# Patient Record
Sex: Male | Born: 1970 | Race: White | Hispanic: Yes | Marital: Married | Smoking: Never smoker
Health system: Southern US, Community
[De-identification: ages and names within clinical notes are randomized; demographics above are authoritative.]

## PROBLEM LIST (undated history)

## (undated) DIAGNOSIS — Z87442 Personal history of urinary calculi: Secondary | ICD-10-CM

## (undated) DIAGNOSIS — K219 Gastro-esophageal reflux disease without esophagitis: Secondary | ICD-10-CM

## (undated) DIAGNOSIS — N529 Male erectile dysfunction, unspecified: Secondary | ICD-10-CM

## (undated) DIAGNOSIS — I10 Essential (primary) hypertension: Secondary | ICD-10-CM

## (undated) DIAGNOSIS — Z8614 Personal history of Methicillin resistant Staphylococcus aureus infection: Secondary | ICD-10-CM

## (undated) DIAGNOSIS — M549 Dorsalgia, unspecified: Secondary | ICD-10-CM

## (undated) DIAGNOSIS — M1990 Unspecified osteoarthritis, unspecified site: Secondary | ICD-10-CM

## (undated) DIAGNOSIS — E7800 Pure hypercholesterolemia, unspecified: Secondary | ICD-10-CM

## (undated) DIAGNOSIS — R011 Cardiac murmur, unspecified: Secondary | ICD-10-CM

## (undated) DIAGNOSIS — G4730 Sleep apnea, unspecified: Secondary | ICD-10-CM

## (undated) HISTORY — PX: LAPAROSCOPIC GASTRIC BYPASS: SUR771

## (undated) HISTORY — PX: SHOULDER SURGERY: SHX246

## (undated) HISTORY — PX: FRACTURE SURGERY: SHX138

## (undated) HISTORY — PX: ELBOW SURGERY: SHX618

## (undated) HISTORY — PX: TONSILLECTOMY: SUR1361

---

## 2014-03-09 DIAGNOSIS — Z8614 Personal history of Methicillin resistant Staphylococcus aureus infection: Secondary | ICD-10-CM

## 2014-03-09 HISTORY — DX: Personal history of Methicillin resistant Staphylococcus aureus infection: Z86.14

## 2015-03-10 HISTORY — PX: SHOULDER SURGERY: SHX246

## 2016-11-07 HISTORY — PX: CERVICAL FUSION: SHX112

## 2017-01-20 ENCOUNTER — Encounter: Payer: Self-pay | Admitting: Emergency Medicine

## 2017-01-20 ENCOUNTER — Emergency Department
Admission: EM | Admit: 2017-01-20 | Discharge: 2017-01-20 | Disposition: A | Discharge status: Home / Self Care | Payer: BLUE CROSS/BLUE SHIELD | Attending: Emergency Medicine | Admitting: Emergency Medicine

## 2017-01-20 DIAGNOSIS — I10 Essential (primary) hypertension: Secondary | ICD-10-CM | POA: Insufficient documentation

## 2017-01-20 DIAGNOSIS — W260XXA Contact with knife, initial encounter: Secondary | ICD-10-CM | POA: Insufficient documentation

## 2017-01-20 DIAGNOSIS — Y93E6 Activity, residential relocation: Secondary | ICD-10-CM | POA: Diagnosis not present

## 2017-01-20 DIAGNOSIS — S61211A Laceration without foreign body of left index finger without damage to nail, initial encounter: Secondary | ICD-10-CM

## 2017-01-20 DIAGNOSIS — Y998 Other external cause status: Secondary | ICD-10-CM | POA: Diagnosis not present

## 2017-01-20 DIAGNOSIS — Y929 Unspecified place or not applicable: Secondary | ICD-10-CM | POA: Insufficient documentation

## 2017-01-20 DIAGNOSIS — S6992XA Unspecified injury of left wrist, hand and finger(s), initial encounter: Secondary | ICD-10-CM | POA: Diagnosis present

## 2017-01-20 HISTORY — DX: Essential (primary) hypertension: I10

## 2017-01-20 NOTE — ED Provider Notes (Signed)
[facility] Emergency Department Provider Note  ____________________________________________  Time seen: Approximately 9:23 PM  I have reviewed the triage vital signs and the nursing notes.   HISTORY  Chief Complaint Laceration    HPI John James is a 46 y.o. male who presents emergency department for a cut to the second digit of his left hand.  Patient reports that he was unpacking from a recent move when he accidentally cut his finger on a knife that was in a box.  Patient did not even feel laceration and only realize he had cut himself after seeing blood.  Patient reports that he was able to control the bleeding with direct pressure.  He has good range of motion to the digit.  He is up-to-date on his tetanus immunization.  No other complaints at this time.  No medications prior to arrival.  Past Medical History:  Diagnosis Date  . Hypertension     There are no active problems to display for this patient.   History reviewed. No pertinent surgical history.  Prior to Admission medications   Not on File    Allergies Patient has no known allergies.  History reviewed. No pertinent family history.  Social History Social History   Tobacco Use  . Smoking status: Never Smoker  . Smokeless tobacco: Never Used  Substance Use Topics  . Alcohol use: Not on file  . Drug use: Not on file     Review of Systems  Constitutional: No fever/chills Cardiovascular: no chest pain. Respiratory: no cough. No SOB. Musculoskeletal: Negative for musculoskeletal pain. Skin: Positive for laceration to the second digit of left hand Neurological: Negative for headaches, focal weakness or numbness. 10-point ROS otherwise negative.  ____________________________________________   PHYSICAL EXAM:  VITAL SIGNS: ED Triage Vitals  Enc Vitals Group     BP 01/20/17 2116 (!) 148/90     Pulse Rate 01/20/17 2116 87     Resp 01/20/17 2116 16     Temp 01/20/17 2112  98 F (36.7 C)     Temp Source 01/20/17 2112 Oral     SpO2 01/20/17 2116 99 %     Weight 01/20/17 2112 210 lb (95.3 kg)     Height 01/20/17 2112 5\' 9"  (1.753 m)     Head Circumference --      Peak Flow --      Pain Score 01/20/17 2117 2     Pain Loc --      Pain Edu? --      Excl. in GC? --      Constitutional: Alert and oriented. Well appearing and in no acute distress. Eyes: Conjunctivae are normal. PERRL. EOMI. Head: Atraumatic. Neck: No stridor.    Cardiovascular: Normal rate, regular rhythm. Normal S1 and S2.  Good peripheral circulation. Respiratory: Normal respiratory effort without tachypnea or retractions. Lungs CTAB. Good air entry to the bases with no decreased or absent breath sounds. Musculoskeletal: Full range of motion to all extremities. No gross deformities appreciated. Neurologic:  Normal speech and language. No gross focal neurologic deficits are appreciated.  Skin:  Skin is warm, dry and intact. No rash noted.  Positive for superficial laceration to the proximal phalanx of the second digit dorsal aspect.  Full range of motion to the digit.  Edges are smooth in nature, not gaped open, no bleeding or foreign body.  Laceration measures approximately 0.75 cm in length.  Sensation and cap refill intact distally. Psychiatric: Mood and affect are normal. Speech and behavior  are normal. Patient exhibits appropriate insight and judgement.   ____________________________________________   LABS (all labs ordered are listed, but only abnormal results are displayed)  Labs Reviewed - No data to display ____________________________________________  EKG   ____________________________________________  RADIOLOGY   No results found.  ____________________________________________    PROCEDURES  Procedure(s) performed:    Marland Kitchen.Marland Kitchen.Laceration Repair Date/Time: 01/20/2017 9:32 PM Performed by: Racheal Patchesuthriell, Jayleigh Notarianni D, PA-C Authorized by: Racheal Patchesuthriell, Yaw Escoto D, PA-C    Consent:    Consent obtained:  Verbal   Consent given by:  Patient   Risks discussed:  Pain Anesthesia (see MAR for exact dosages):    Anesthesia method:  None Laceration details:    Location:  Finger   Finger location:  L index finger   Length (cm):  0.8 Repair type:    Repair type:  Simple Exploration:    Hemostasis achieved with:  Direct pressure   Wound exploration: wound explored through full range of motion and entire depth of wound probed and visualized     Wound extent: no foreign bodies/material noted, no muscle damage noted, no nerve damage noted, no tendon damage noted, no underlying fracture noted and no vascular damage noted     Contaminated: no   Treatment:    Area cleansed with:  Shur-Clens   Amount of cleaning:  Standard Skin repair:    Repair method:  Steri-Strips and tissue adhesive   Number of Steri-Strips:  3 Approximation:    Approximation:  Close Post-procedure details:    Dressing:  Open (no dressing)   Patient tolerance of procedure:  Tolerated well, no immediate complications Comments:     Wound is sealed using Dermabond.  3 Steri-Strips were applied over top for further stabilization of a small laceration.      Medications - No data to display   ____________________________________________   INITIAL IMPRESSION / ASSESSMENT AND PLAN / ED COURSE  Pertinent labs & imaging results that were available during my care of the patient were reviewed by me and considered in my medical decision making (see chart for details).  Review of the [place] CSRS was performed in accordance of the [facility] prior to dispensing any controlled drugs.     Patient's diagnosis is consistent with finger laceration.  She presented with small laceration to the dorsal aspect of the second digit.  Area was closable with either 2 sutures or Dermabond and Steri-Strips.  Patient given choice, chooses Dermabond and Steri-Strips.  These were applied as described above.  No  complications.  Wound care instructions given to patient.  No prescriptions at this time.  He was up-to-date on tetanus immunization.  Patient will follow primary care as needed..  Patient is given ED precautions to return to the ED for any worsening or new symptoms.     ____________________________________________  FINAL CLINICAL IMPRESSION(S) / ED DIAGNOSES  Final diagnoses:  Laceration of left index finger without foreign body without damage to nail, initial encounter      NEW MEDICATIONS STARTED DURING THIS VISIT:  ED Discharge Orders    None          This chart was dictated using voice recognition software/Dragon. Despite best efforts to proofread, errors can occur which can change the meaning. Any change was purely unintentional.    Racheal PatchesCuthriell, Shalona Harbour D, PA-C 01/20/17 2148    Phineas SemenGoodman, Graydon, MD 01/20/17 [id number]

## 2017-01-20 NOTE — ED Triage Notes (Signed)
Pt reports he cut his left index finger with a knife that was in a box that pt was moving. Pt has approximate 1 inch laceration to the area with bleeding controled. New bandage applied in triage.

## 2017-03-23 ENCOUNTER — Other Ambulatory Visit: Payer: Self-pay | Admitting: Orthopedic Surgery

## 2017-03-23 DIAGNOSIS — M25562 Pain in left knee: Secondary | ICD-10-CM

## 2017-03-30 ENCOUNTER — Ambulatory Visit
Admission: RE | Admit: 2017-03-30 | Discharge: 2017-03-30 | Disposition: A | Discharge status: Home / Self Care | Payer: BLUE CROSS/BLUE SHIELD | Source: Ambulatory Visit | Attending: Orthopedic Surgery | Admitting: Orthopedic Surgery

## 2017-03-30 DIAGNOSIS — X58XXXA Exposure to other specified factors, initial encounter: Secondary | ICD-10-CM | POA: Insufficient documentation

## 2017-03-30 DIAGNOSIS — M25562 Pain in left knee: Secondary | ICD-10-CM

## 2017-03-30 DIAGNOSIS — R600 Localized edema: Secondary | ICD-10-CM | POA: Insufficient documentation

## 2017-03-30 DIAGNOSIS — S83282A Other tear of lateral meniscus, current injury, left knee, initial encounter: Secondary | ICD-10-CM | POA: Diagnosis not present

## 2017-05-24 ENCOUNTER — Ambulatory Visit: Admission: RE | Admit: 2017-05-24 | Payer: BLUE CROSS/BLUE SHIELD | Source: Ambulatory Visit

## 2017-06-17 ENCOUNTER — Inpatient Hospital Stay
Admission: RE | Admit: 2017-06-17 | Discharge: 2017-06-17 | Disposition: A | Discharge status: Home / Self Care | Payer: No Typology Code available for payment source | Source: Ambulatory Visit

## 2017-06-17 HISTORY — DX: Pure hypercholesterolemia, unspecified: E78.00

## 2017-06-17 HISTORY — DX: Unspecified osteoarthritis, unspecified site: M19.90

## 2017-06-18 ENCOUNTER — Inpatient Hospital Stay: Admission: RE | Admit: 2017-06-18 | Payer: No Typology Code available for payment source | Source: Ambulatory Visit

## 2017-06-21 ENCOUNTER — Inpatient Hospital Stay: Admission: RE | Admit: 2017-06-21 | Payer: No Typology Code available for payment source | Source: Ambulatory Visit

## 2017-06-22 ENCOUNTER — Inpatient Hospital Stay
Admission: RE | Admit: 2017-06-22 | Discharge: 2017-06-22 | Disposition: A | Discharge status: Home / Self Care | Payer: No Typology Code available for payment source | Source: Ambulatory Visit

## 2017-06-22 NOTE — Pre-Procedure Instructions (Signed)
[facility] ABOUT MULTIPLE CALLS TO PT FOR PREOP INTERVIEW AND HE IS NOT RETURNING ANY OF MY CALLS.  CASEY SAID SHE TRIED TO GET IN TOUCH WITH PT AND HE HAS NOT RETURNED HER CALL EITHER.

## 2017-06-24 ENCOUNTER — Ambulatory Visit
Admission: RE | Admit: 2017-06-24 | Payer: BLUE CROSS/BLUE SHIELD | Source: Ambulatory Visit | Admitting: Orthopedic Surgery

## 2017-06-24 ENCOUNTER — Encounter: Admission: RE | Payer: Self-pay | Source: Ambulatory Visit

## 2017-06-24 SURGERY — ARTHROSCOPY, KNEE, WITH MEDIAL MENISCECTOMY
Anesthesia: Choice | Laterality: Left

## 2017-07-06 ENCOUNTER — Other Ambulatory Visit: Payer: Self-pay | Admitting: Internal Medicine

## 2017-07-06 DIAGNOSIS — R7989 Other specified abnormal findings of blood chemistry: Secondary | ICD-10-CM

## 2017-07-06 DIAGNOSIS — R945 Abnormal results of liver function studies: Principal | ICD-10-CM

## 2017-07-06 DIAGNOSIS — R42 Dizziness and giddiness: Secondary | ICD-10-CM

## 2017-07-15 ENCOUNTER — Ambulatory Visit: Payer: BLUE CROSS/BLUE SHIELD

## 2017-07-15 ENCOUNTER — Ambulatory Visit: Admission: RE | Admit: 2017-07-15 | Payer: BLUE CROSS/BLUE SHIELD | Source: Ambulatory Visit

## 2017-09-22 ENCOUNTER — Other Ambulatory Visit: Payer: Self-pay

## 2017-09-22 ENCOUNTER — Other Ambulatory Visit: Payer: Self-pay | Admitting: Physical Medicine and Rehabilitation

## 2017-09-22 DIAGNOSIS — M5416 Radiculopathy, lumbar region: Secondary | ICD-10-CM

## 2017-09-22 DIAGNOSIS — M5412 Radiculopathy, cervical region: Secondary | ICD-10-CM

## 2017-10-11 ENCOUNTER — Ambulatory Visit
Admission: RE | Admit: 2017-10-11 | Discharge: 2017-10-11 | Disposition: A | Discharge status: Home / Self Care | Payer: BLUE CROSS/BLUE SHIELD | Source: Ambulatory Visit | Attending: Physical Medicine and Rehabilitation | Admitting: Physical Medicine and Rehabilitation

## 2017-10-11 DIAGNOSIS — M5416 Radiculopathy, lumbar region: Secondary | ICD-10-CM | POA: Diagnosis present

## 2017-10-11 DIAGNOSIS — M48061 Spinal stenosis, lumbar region without neurogenic claudication: Secondary | ICD-10-CM | POA: Insufficient documentation

## 2017-10-11 DIAGNOSIS — M5126 Other intervertebral disc displacement, lumbar region: Secondary | ICD-10-CM | POA: Insufficient documentation

## 2017-10-11 DIAGNOSIS — M5412 Radiculopathy, cervical region: Secondary | ICD-10-CM

## 2018-01-05 ENCOUNTER — Encounter
Admission: RE | Admit: 2018-01-05 | Discharge: 2018-01-05 | Disposition: A | Discharge status: Home / Self Care | Payer: BLUE CROSS/BLUE SHIELD | Source: Ambulatory Visit | Attending: Orthopedic Surgery | Admitting: Orthopedic Surgery

## 2018-01-05 ENCOUNTER — Other Ambulatory Visit: Payer: Self-pay

## 2018-01-05 DIAGNOSIS — I10 Essential (primary) hypertension: Secondary | ICD-10-CM | POA: Insufficient documentation

## 2018-01-05 DIAGNOSIS — Z01810 Encounter for preprocedural cardiovascular examination: Secondary | ICD-10-CM | POA: Diagnosis not present

## 2018-01-05 HISTORY — DX: Cardiac murmur, unspecified: R01.1

## 2018-01-05 HISTORY — DX: Male erectile dysfunction, unspecified: N52.9

## 2018-01-05 HISTORY — DX: Personal history of Methicillin resistant Staphylococcus aureus infection: Z86.14

## 2018-01-05 HISTORY — DX: Sleep apnea, unspecified: G47.30

## 2018-01-05 NOTE — Patient Instructions (Signed)
Your procedure is scheduled on: Thursday, January 13, 2018 Report to Day Surgery on the 2nd floor of the [facility] To find out your arrival time, please call [phone] between 1PM - 3PM on: Wednesday, January 12, 2018  REMEMBER: Instructions that are not followed completely may result in serious medical risk, up to and including death; or upon the discretion of your surgeon and anesthesiologist your surgery may need to be rescheduled.  Do not eat food after midnight the night before surgery.  No gum chewing, lozengers or hard candies.  You may however, drink CLEAR liquids up to 2 hours before you are scheduled to arrive for your surgery. Do not drink anything within 2 hours of the start of your surgery.  Clear liquids include: - water  - apple juice without pulp - gatorade - black coffee or tea (Do NOT add milk or creamers to the coffee or tea) Do NOT drink anything that is not on this list.  No Alcohol for 24 hours before or after surgery.  No Smoking including e-cigarettes for 24 hours prior to surgery.  No chewable tobacco products for at least 6 hours prior to surgery.  No nicotine patches on the day of surgery.  On the morning of surgery brush your teeth with toothpaste and water, you may rinse your mouth with mouthwash if you wish. Do not swallow any toothpaste or mouthwash.  Notify your doctor if there is any change in your medical condition (cold, fever, infection).  Do not wear jewelry, make-up, hairpins, clips or nail polish.  Do not wear lotions, powders, or perfumes.   Do not shave 48 hours prior to surgery.   Contacts and dentures may not be worn into surgery.  Do not bring valuables to the hospital, including drivers license, insurance or credit cards.  [facility] is not responsible for any belongings or valuables.   TAKE THESE MEDICATIONS THE MORNING OF SURGERY:   1.  Tramadol (if needed for pain)  Use CHG Soap as directed on instruction  sheet.  Bring your C-PAP to the hospital with you in case you may have to spend the night.   NOW!  Stop Anti-inflammatories (NSAIDS) such as Advil, Aleve, Ibuprofen, Motrin, Naproxen, Naprosyn and Aspirin based products such as Excedrin, Goodys Powder, BC Powder. (May take Tylenol or Acetaminophen if needed.)  NOW! Stop ANY OVER THE COUNTER supplements until after surgery.  Wear comfortable clothing (specific to your surgery type) to the hospital.  Plan for stool softeners for home use.  If you are being discharged the day of surgery, you will not be allowed to drive home. You will need a responsible adult to drive you home and stay with you that night.   If you are taking public transportation, you will need to have a responsible adult with you. Please confirm with your physician that it is acceptable to use public transportation.   Please call [phone] if you have any questions about these instructions.

## 2018-01-12 MED ORDER — CEFAZOLIN SODIUM-DEXTROSE 2-4 GM/100ML-% IV SOLN
2.0000 g | Freq: Once | INTRAVENOUS | Status: AC
  Administered 2018-01-13: 2 g via INTRAVENOUS

## 2018-01-13 ENCOUNTER — Ambulatory Visit
Admission: RE | Admit: 2018-01-13 | Discharge: 2018-01-13 | Disposition: A | Discharge status: Home / Self Care | Payer: BLUE CROSS/BLUE SHIELD | Source: Ambulatory Visit | Attending: Orthopedic Surgery | Admitting: Orthopedic Surgery

## 2018-01-13 ENCOUNTER — Ambulatory Visit: Payer: BLUE CROSS/BLUE SHIELD | Admitting: Certified Registered Nurse Anesthetist

## 2018-01-13 ENCOUNTER — Encounter
Admission: RE | Disposition: A | Discharge status: Home / Self Care | Payer: Self-pay | Source: Ambulatory Visit | Attending: Orthopedic Surgery

## 2018-01-13 ENCOUNTER — Encounter: Payer: Self-pay | Admitting: *Deleted

## 2018-01-13 ENCOUNTER — Other Ambulatory Visit: Payer: Self-pay

## 2018-01-13 DIAGNOSIS — S83282A Other tear of lateral meniscus, current injury, left knee, initial encounter: Secondary | ICD-10-CM | POA: Diagnosis present

## 2018-01-13 DIAGNOSIS — M23262 Derangement of other lateral meniscus due to old tear or injury, left knee: Secondary | ICD-10-CM | POA: Diagnosis not present

## 2018-01-13 DIAGNOSIS — Z9884 Bariatric surgery status: Secondary | ICD-10-CM | POA: Diagnosis not present

## 2018-01-13 DIAGNOSIS — M6752 Plica syndrome, left knee: Secondary | ICD-10-CM | POA: Insufficient documentation

## 2018-01-13 DIAGNOSIS — Z820 Family history of epilepsy and other diseases of the nervous system: Secondary | ICD-10-CM | POA: Insufficient documentation

## 2018-01-13 DIAGNOSIS — I10 Essential (primary) hypertension: Secondary | ICD-10-CM | POA: Insufficient documentation

## 2018-01-13 DIAGNOSIS — M1990 Unspecified osteoarthritis, unspecified site: Secondary | ICD-10-CM | POA: Insufficient documentation

## 2018-01-13 DIAGNOSIS — Z79899 Other long term (current) drug therapy: Secondary | ICD-10-CM | POA: Insufficient documentation

## 2018-01-13 DIAGNOSIS — E7800 Pure hypercholesterolemia, unspecified: Secondary | ICD-10-CM | POA: Insufficient documentation

## 2018-01-13 DIAGNOSIS — G4730 Sleep apnea, unspecified: Secondary | ICD-10-CM | POA: Diagnosis not present

## 2018-01-13 HISTORY — PX: KNEE ARTHROSCOPY WITH MEDIAL MENISECTOMY: SHX5651

## 2018-01-13 SURGERY — ARTHROSCOPY, KNEE, WITH MEDIAL MENISCECTOMY
Anesthesia: General | Laterality: Left

## 2018-01-13 MED ORDER — PHENYLEPHRINE HCL 10 MG/ML IJ SOLN
INTRAMUSCULAR | Status: DC | PRN
  Administered 2018-01-13 (×2): 100 ug via INTRAVENOUS

## 2018-01-13 MED ORDER — FENTANYL CITRATE (PF) 100 MCG/2ML IJ SOLN
INTRAMUSCULAR | Status: DC | PRN
  Administered 2018-01-13 (×4): 25 ug via INTRAVENOUS

## 2018-01-13 MED ORDER — ONDANSETRON HCL 4 MG/2ML IJ SOLN
INTRAMUSCULAR | Status: AC
  Filled 2018-01-13: qty 2

## 2018-01-13 MED ORDER — PROPOFOL 10 MG/ML IV BOLUS
INTRAVENOUS | Status: DC | PRN
  Administered 2018-01-13: 200 mg via INTRAVENOUS

## 2018-01-13 MED ORDER — ONDANSETRON HCL 4 MG/2ML IJ SOLN: 4.0000 mg | Freq: Four times a day (QID) | INTRAMUSCULAR | Status: DC | PRN

## 2018-01-13 MED ORDER — MIDAZOLAM HCL 2 MG/2ML IJ SOLN
INTRAMUSCULAR | Status: DC | PRN
  Administered 2018-01-13: 2 mg via INTRAVENOUS

## 2018-01-13 MED ORDER — CEFAZOLIN SODIUM-DEXTROSE 2-4 GM/100ML-% IV SOLN
INTRAVENOUS | Status: AC
  Filled 2018-01-13: qty 100

## 2018-01-13 MED ORDER — FENTANYL CITRATE (PF) 100 MCG/2ML IJ SOLN
INTRAMUSCULAR | Status: AC
  Administered 2018-01-13: 25 ug via INTRAVENOUS
  Filled 2018-01-13: qty 2

## 2018-01-13 MED ORDER — HYDROCODONE-ACETAMINOPHEN 5-325 MG PO TABS: 1.0000 | ORAL_TABLET | Freq: Four times a day (QID) | ORAL | 0 refills | Status: DC | PRN

## 2018-01-13 MED ORDER — FAMOTIDINE 20 MG PO TABS
20.0000 mg | ORAL_TABLET | Freq: Once | ORAL | Status: AC
  Administered 2018-01-13: 20 mg via ORAL

## 2018-01-13 MED ORDER — LIDOCAINE HCL (CARDIAC) PF 100 MG/5ML IV SOSY
PREFILLED_SYRINGE | INTRAVENOUS | Status: DC | PRN
  Administered 2018-01-13: 60 mg via INTRAVENOUS

## 2018-01-13 MED ORDER — EPHEDRINE SULFATE 50 MG/ML IJ SOLN
INTRAMUSCULAR | Status: DC | PRN
  Administered 2018-01-13: 10 mg via INTRAVENOUS
  Administered 2018-01-13: 15 mg via INTRAVENOUS
  Administered 2018-01-13: 5 mg via INTRAVENOUS
  Administered 2018-01-13: 10 mg via INTRAVENOUS

## 2018-01-13 MED ORDER — ONDANSETRON HCL 4 MG/2ML IJ SOLN: 4.0000 mg | Freq: Once | INTRAMUSCULAR | Status: DC | PRN

## 2018-01-13 MED ORDER — DEXAMETHASONE SODIUM PHOSPHATE 10 MG/ML IJ SOLN
INTRAMUSCULAR | Status: AC
  Filled 2018-01-13: qty 1

## 2018-01-13 MED ORDER — HYDROCODONE-ACETAMINOPHEN 5-325 MG PO TABS: 1.0000 | ORAL_TABLET | ORAL | Status: DC | PRN

## 2018-01-13 MED ORDER — PROPOFOL 10 MG/ML IV BOLUS
INTRAVENOUS | Status: AC
  Filled 2018-01-13: qty 20

## 2018-01-13 MED ORDER — FENTANYL CITRATE (PF) 100 MCG/2ML IJ SOLN
INTRAMUSCULAR | Status: AC
  Filled 2018-01-13: qty 2

## 2018-01-13 MED ORDER — FENTANYL CITRATE (PF) 100 MCG/2ML IJ SOLN
25.0000 ug | INTRAMUSCULAR | Status: DC | PRN
  Administered 2018-01-13 (×4): 25 ug via INTRAVENOUS

## 2018-01-13 MED ORDER — DEXAMETHASONE SODIUM PHOSPHATE 10 MG/ML IJ SOLN
INTRAMUSCULAR | Status: DC | PRN
  Administered 2018-01-13: 10 mg via INTRAVENOUS

## 2018-01-13 MED ORDER — FAMOTIDINE 20 MG PO TABS
ORAL_TABLET | ORAL | Status: AC
  Administered 2018-01-13: 20 mg via ORAL
  Filled 2018-01-13: qty 1

## 2018-01-13 MED ORDER — SODIUM CHLORIDE 0.9 % IV SOLN: INTRAVENOUS | Status: DC

## 2018-01-13 MED ORDER — ONDANSETRON HCL 4 MG/2ML IJ SOLN
INTRAMUSCULAR | Status: DC | PRN
  Administered 2018-01-13: 4 mg via INTRAVENOUS

## 2018-01-13 MED ORDER — METOCLOPRAMIDE HCL 10 MG PO TABS: 5.0000 mg | ORAL_TABLET | Freq: Three times a day (TID) | ORAL | Status: DC | PRN

## 2018-01-13 MED ORDER — LACTATED RINGERS IV SOLN
INTRAVENOUS | Status: DC
  Administered 2018-01-13: 12:00:00 via INTRAVENOUS

## 2018-01-13 MED ORDER — ONDANSETRON HCL 4 MG PO TABS: 4.0000 mg | ORAL_TABLET | Freq: Four times a day (QID) | ORAL | Status: DC | PRN

## 2018-01-13 MED ORDER — MIDAZOLAM HCL 2 MG/2ML IJ SOLN
INTRAMUSCULAR | Status: AC
  Filled 2018-01-13: qty 2

## 2018-01-13 MED ORDER — METOCLOPRAMIDE HCL 5 MG/ML IJ SOLN: 5.0000 mg | Freq: Three times a day (TID) | INTRAMUSCULAR | Status: DC | PRN

## 2018-01-13 MED ORDER — LIDOCAINE HCL (PF) 2 % IJ SOLN
INTRAMUSCULAR | Status: AC
  Filled 2018-01-13: qty 10

## 2018-01-13 MED ORDER — BUPIVACAINE-EPINEPHRINE (PF) 0.5% -1:200000 IJ SOLN
INTRAMUSCULAR | Status: DC | PRN
  Administered 2018-01-13: 20 mL via PERINEURAL

## 2018-01-13 SURGICAL SUPPLY — 29 items
BANDAGE ACE 4X5 VEL STRL LF (GAUZE/BANDAGES/DRESSINGS) IMPLANT
BLADE INCISOR PLUS 4.5 (BLADE) IMPLANT
CHLORAPREP W/TINT 26ML (MISCELLANEOUS) ×2 IMPLANT
COVER WAND RF STERILE (DRAPES) ×2 IMPLANT
CUFF TOURN 24 STER (MISCELLANEOUS) IMPLANT
CUFF TOURN 30 STER DUAL PORT (MISCELLANEOUS) ×1 IMPLANT
GAUZE SPONGE 4X4 12PLY STRL (GAUZE/BANDAGES/DRESSINGS) ×2 IMPLANT
GLOVE SURG SYN 9.0  PF PI (GLOVE) ×1
GLOVE SURG SYN 9.0 PF PI (GLOVE) ×1 IMPLANT
GOWN SRG 2XL LVL 4 RGLN SLV (GOWNS) ×1 IMPLANT
GOWN STRL NON-REIN 2XL LVL4 (GOWNS) ×1
GOWN STRL REUS W/ TWL LRG LVL3 (GOWN DISPOSABLE) ×2 IMPLANT
GOWN STRL REUS W/TWL LRG LVL3 (GOWN DISPOSABLE) ×2
GREAT WHITE STEALTH ×1 IMPLANT
IV LACTATED RINGER IRRG 3000ML (IV SOLUTION) ×2
IV LR IRRIG 3000ML ARTHROMATIC (IV SOLUTION) ×2 IMPLANT
KIT TURNOVER KIT A (KITS) ×2 IMPLANT
MANIFOLD NEPTUNE II (INSTRUMENTS) ×2 IMPLANT
NEEDLE HYPO 22GX1.5 SAFETY (NEEDLE) ×2 IMPLANT
PACK ARTHROSCOPY KNEE (MISCELLANEOUS) ×2 IMPLANT
PROBE BIPOLAR 50 DEGREE SUCT (MISCELLANEOUS) ×1 IMPLANT
SCALPEL PROTECTED #11 DISP (BLADE) ×2 IMPLANT
SET TUBE SUCT SHAVER OUTFL 24K (TUBING) ×2 IMPLANT
SET TUBE TIP INTRA-ARTICULAR (MISCELLANEOUS) ×1 IMPLANT
SUT ETHILON 4-0 (SUTURE) ×1
SUT ETHILON 4-0 FS2 18XMFL BLK (SUTURE) ×1
SUTURE ETHLN 4-0 FS2 18XMF BLK (SUTURE) ×1 IMPLANT
TUBING ARTHRO INFLOW-ONLY STRL (TUBING) ×2 IMPLANT
WAND COBLATION FLOW 50 (SURGICAL WAND) ×1 IMPLANT

## 2018-01-13 NOTE — H&P (Signed)
Reviewed paper H+P, will be scanned into chart. No changes noted.  

## 2018-01-13 NOTE — Op Note (Signed)
01/13/2018  2:50 PM  PATIENT:  John James  47 y.o. male  PRE-OPERATIVE DIAGNOSIS:  MECHANICAL KNEE PAIN LEFT knee  POST-OPERATIVE DIAGNOSIS: Lateral meniscus tear left knee with plica band  PROCEDURE:  Procedure(s): KNEE ARTHROSCOPY WITH MEDIAL MENISECTOMY (Left), partial synovectomy SURGEON: Leitha Schuller, MD  ASSISTANTS: None  ANESTHESIA:   general  EBL:  Total I/O In: 800 [I.V.:800] Out: 25 [Blood:25]  BLOOD ADMINISTERED:none  DRAINS: none   LOCAL MEDICATIONS USED:  NONE  SPECIMEN:  No Specimen  DISPOSITION OF SPECIMEN:  N/A  COUNTS:  YES  TOURNIQUET:  * Missing tourniquet times found for documented tourniquets in log: [id number] *  IMPLANTS: None  DICTATION: .Dragon Dictation patient was brought to the operating room and after adequate general anesthesia was obtained the left leg was placed in the arthroscopic leg holder with a tourniquet applied but not required.  After patient identification and timeout procedures were completed, an inferolateral portal was made and the arthroscope introduced.  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  Coming on the medial compartment inferior medial portal was made and the medial compartment was normal in appearance except for some anterior synovitis and intact ACL.  The lateral compartment significant for radial tear with some horizontal component in the midportion of the middle third at this point the arthroscopic shaver was used to debride the plica as well as some inflamed inflamed fat pad anteriorly as well as that synovium impinging on the anterior medial compartment the medial meniscus was intact after addressing this pathology the lateral meniscus was addressed with ArthroCare wand getting back to a stable margin and is loose one small flap tear that could be displaced of the joint was  addressed the meniscus appeared stable after this the gutters were checked and there were no loose bodies after thorough irrigation of the knee almost mentation was withdrawn and the wounds were closed with simple interrupted 4-0 nylon.  Xeroform 4 x 4's web roll and Ace wrap applied pre-and post procedure pictures obtained through the arthroscope  PLAN OF CARE: Discharge to home after PACU  PATIENT DISPOSITION:  PACU - hemodynamically stable.

## 2018-01-13 NOTE — Transfer of Care (Signed)
Immediate Anesthesia Transfer of Care Note  Patient: John James  Procedure(s) Performed: KNEE ARTHROSCOPY WITH MEDIAL MENISECTOMY (Left )  Patient Location: PACU  Anesthesia Type:General  Level of Consciousness: drowsy  Airway & Oxygen Therapy: Patient Spontanous Breathing and Patient connected to face mask oxygen  Post-op Assessment: Report given to RN and Post -op Vital signs reviewed and stable  Post vital signs: Reviewed and stable  Last Vitals:  Vitals Value Taken Time  BP 91/58 01/13/2018  2:54 PM  Temp 36.4 C 01/13/2018  2:54 PM  Pulse 74 01/13/2018  2:55 PM  Resp 14 01/13/2018  2:55 PM  SpO2 94 % 01/13/2018  2:55 PM  Vitals shown include unvalidated device data.  Last Pain:  Vitals:   01/13/18 1115  TempSrc: Tympanic  PainSc: 0-No pain         Complications: No apparent anesthesia complications

## 2018-01-13 NOTE — Discharge Instructions (Signed)
Weightbearing as tolerated on left leg to try to minimize activities through the weekend.  Pain medicine as directed.  Aspirin 81 mg twice a day for 3 weeks.  Dressing in place keep clean and dry.

## 2018-01-13 NOTE — Anesthesia Post-op Follow-up Note (Signed)
Anesthesia QCDR form completed.        

## 2018-01-13 NOTE — Anesthesia Preprocedure Evaluation (Addendum)
Anesthesia Evaluation  Patient identified by MRN, date of birth, ID band Patient awake    Reviewed: Allergy & Precautions, NPO status , Patient's Chart, lab work & pertinent test results  History of Anesthesia Complications Negative for: history of anesthetic complications  Airway Mallampati: II       Dental   Pulmonary sleep apnea and Continuous Positive Airway Pressure Ventilation , neg COPD,           Cardiovascular hypertension, Pt. on medications + Valvular Problems/Murmurs      Neuro/Psych neg Seizures    GI/Hepatic Neg liver ROS, neg GERD  ,  Endo/Other  neg diabetes  Renal/GU negative Renal ROS     Musculoskeletal   Abdominal   Peds  Hematology   Anesthesia Other Findings   Reproductive/Obstetrics                            Anesthesia Physical Anesthesia Plan  ASA: III  Anesthesia Plan: General   Post-op Pain Management:    Induction:   PONV Risk Score and Plan:   Airway Management Planned: LMA  Additional Equipment:   Intra-op Plan:   Post-operative Plan:   Informed Consent: I have reviewed the patients History and Physical, chart, labs and discussed the procedure including the risks, benefits and alternatives for the proposed anesthesia with the patient or authorized representative who has indicated his/her understanding and acceptance.     Plan Discussed with:   Anesthesia Plan Comments:        Anesthesia Quick Evaluation

## 2018-01-13 NOTE — Anesthesia Procedure Notes (Signed)
Procedure Name: LMA Insertion Date/Time: 01/13/2018 2:10 PM Performed by: Mariana Kaufman, RN Pre-anesthesia Checklist: Patient identified, Emergency Drugs available, Suction available, Patient being monitored and Timeout performed Patient Re-evaluated:Patient Re-evaluated prior to induction Oxygen Delivery Method: Circle system utilized Preoxygenation: Pre-oxygenation with 100% oxygen Induction Type: IV induction Ventilation: Mask ventilation without difficulty LMA: LMA inserted LMA Size: 4.5 Number of attempts: 1 Placement Confirmation: positive ETCO2 and breath sounds checked- equal and bilateral Tube secured with: Tape Dental Injury: Teeth and Oropharynx as per pre-operative assessment

## 2018-01-13 NOTE — Anesthesia Postprocedure Evaluation (Signed)
Anesthesia Post Note  Patient: John James  Procedure(s) Performed: KNEE ARTHROSCOPY WITH MEDIAL MENISECTOMY (Left )  Patient location during evaluation: PACU Anesthesia Type: General Level of consciousness: awake and alert Pain management: pain level controlled Vital Signs Assessment: post-procedure vital signs reviewed and stable Respiratory status: spontaneous breathing and respiratory function stable Cardiovascular status: stable Anesthetic complications: no     Last Vitals:  Vitals:   01/13/18 1524 01/13/18 1529  BP: 123/84 123/84  Pulse: 86 89  Resp: 11 15  Temp:    SpO2: 95% 91%    Last Pain:  Vitals:   01/13/18 1529  TempSrc:   PainSc: 5                  [name]

## 2018-01-14 ENCOUNTER — Encounter: Payer: Self-pay | Admitting: Orthopedic Surgery

## 2018-03-30 ENCOUNTER — Encounter
Admission: RE | Admit: 2018-03-30 | Discharge: 2018-03-30 | Disposition: A | Discharge status: Home / Self Care | Payer: BLUE CROSS/BLUE SHIELD | Source: Ambulatory Visit | Attending: Orthopedic Surgery | Admitting: Orthopedic Surgery

## 2018-03-30 ENCOUNTER — Other Ambulatory Visit: Payer: Self-pay

## 2018-03-30 DIAGNOSIS — Z01812 Encounter for preprocedural laboratory examination: Secondary | ICD-10-CM | POA: Insufficient documentation

## 2018-03-30 HISTORY — DX: Gastro-esophageal reflux disease without esophagitis: K21.9

## 2018-03-30 HISTORY — DX: Dorsalgia, unspecified: M54.9

## 2018-03-30 LAB — SURGICAL PCR SCREEN
MRSA, PCR: NEGATIVE
Staphylococcus aureus: NEGATIVE

## 2018-03-30 NOTE — Patient Instructions (Signed)
Your procedure is scheduled on: April 07, 2018 Report to Day Surgery on the 2nd floor of the [facility] To find out your arrival time, please call [phone] between 1PM - 3PM on: Wednesday  April 06, 2018  REMEMBER: Instructions that are not followed completely may result in serious medical risk, up to and including death; or upon the discretion of your surgeon and anesthesiologist your surgery may need to be rescheduled.  Do not eat food after midnight the night before surgery.  No gum chewing, lozengers or hard candies.  You may however, drink CLEAR liquids up to 2 hours before you are scheduled to arrive for your surgery. Do not drink anything within 2 hours of the start of your surgery.  Clear liquids include: - water  - apple juice without pulp -CLEAR  gatorade - black coffee or tea (Do NOT add milk or creamers to the coffee or tea) Do NOT drink anything that is not on this list.  Type 1 and Type 2 diabetics should only drink water.  No Alcohol for 24 hours before or after surgery.  No Smoking including e-cigarettes for 24 hours prior to surgery.  No chewable tobacco products for at least 6 hours prior to surgery.  No nicotine patches on the day of surgery.  On the morning of surgery brush your teeth with toothpaste and water, you may rinse your mouth with mouthwash if you wish. Do not swallow any toothpaste or mouthwash.  Notify your doctor if there is any change in your medical condition (cold, fever, infection).  Do not wear jewelry, make-up, hairpins, clips or nail polish.  Do not wear lotions, powders, or perfumes.   Do not shave 48 hours prior to surgery.   Contacts and dentures may not be worn into surgery.  Do not bring valuables to the hospital, including drivers license, insurance or credit cards.  [facility] is not responsible for any belongings or valuables.   TAKE THESE MEDICATIONS THE MORNING OF SURGERY: ATORVASTATIN    Use CHG Soap as  directed on instruction sheet.  Bring your C-PAP to the hospital with you in case you may have to spend the night.   Stop Anti-inflammatories (NSAIDS) such as Advil, Aleve, Ibuprofen, Motrin, Naproxen, Naprosyn and Aspirin based products such as Excedrin, Goodys Powder, BC Powder. AND RELAFEN - STOP NOW (May take Tylenol or Acetaminophen if needed.)  Stop ANY OVER THE COUNTER supplements until after surgery. (May continue Vitamin D, Vitamin B, and multivitamin.)  Wear comfortable clothing (specific to your surgery type) to the hospital.  Plan for stool softeners for home use.  If you are being admitted to the hospital overnight, leave your suitcase in the car. After surgery it may be brought to your room.  If you are being discharged the day of surgery, you will not be allowed to drive home. You will need a responsible adult to drive you home and stay with you that night.   If you are taking public transportation, you will need to have a responsible adult with you. Please confirm with your physician that it is acceptable to use public transportation.   Please call [phone] if you have any questions about these instructions.

## 2018-04-06 MED ORDER — CEFAZOLIN SODIUM-DEXTROSE 2-4 GM/100ML-% IV SOLN
2.0000 g | Freq: Once | INTRAVENOUS | Status: AC
  Administered 2018-04-07: 2 g via INTRAVENOUS

## 2018-04-07 ENCOUNTER — Ambulatory Visit: Payer: BLUE CROSS/BLUE SHIELD | Admitting: Anesthesiology

## 2018-04-07 ENCOUNTER — Encounter
Admission: RE | Disposition: A | Discharge status: Home / Self Care | Payer: Self-pay | Source: Home / Self Care | Attending: Orthopedic Surgery

## 2018-04-07 ENCOUNTER — Ambulatory Visit
Admission: RE | Admit: 2018-04-07 | Discharge: 2018-04-07 | Disposition: A | Discharge status: Home / Self Care | Payer: BLUE CROSS/BLUE SHIELD | Attending: Orthopedic Surgery | Admitting: Orthopedic Surgery

## 2018-04-07 ENCOUNTER — Other Ambulatory Visit: Payer: Self-pay

## 2018-04-07 DIAGNOSIS — X58XXXA Exposure to other specified factors, initial encounter: Secondary | ICD-10-CM | POA: Insufficient documentation

## 2018-04-07 DIAGNOSIS — E7800 Pure hypercholesterolemia, unspecified: Secondary | ICD-10-CM | POA: Diagnosis not present

## 2018-04-07 DIAGNOSIS — G4730 Sleep apnea, unspecified: Secondary | ICD-10-CM | POA: Diagnosis not present

## 2018-04-07 DIAGNOSIS — Z981 Arthrodesis status: Secondary | ICD-10-CM | POA: Insufficient documentation

## 2018-04-07 DIAGNOSIS — Z820 Family history of epilepsy and other diseases of the nervous system: Secondary | ICD-10-CM | POA: Insufficient documentation

## 2018-04-07 DIAGNOSIS — M654 Radial styloid tenosynovitis [de Quervain]: Secondary | ICD-10-CM | POA: Insufficient documentation

## 2018-04-07 DIAGNOSIS — I10 Essential (primary) hypertension: Secondary | ICD-10-CM | POA: Diagnosis not present

## 2018-04-07 DIAGNOSIS — Z79899 Other long term (current) drug therapy: Secondary | ICD-10-CM | POA: Insufficient documentation

## 2018-04-07 DIAGNOSIS — S63041A Subluxation of carpometacarpal joint of right thumb, initial encounter: Secondary | ICD-10-CM | POA: Diagnosis present

## 2018-04-07 DIAGNOSIS — Z9884 Bariatric surgery status: Secondary | ICD-10-CM | POA: Diagnosis not present

## 2018-04-07 DIAGNOSIS — K219 Gastro-esophageal reflux disease without esophagitis: Secondary | ICD-10-CM | POA: Diagnosis not present

## 2018-04-07 DIAGNOSIS — M1990 Unspecified osteoarthritis, unspecified site: Secondary | ICD-10-CM | POA: Diagnosis not present

## 2018-04-07 HISTORY — PX: CARPOMETACARPAL (CMC) FUSION OF THUMB: SHX6290

## 2018-04-07 SURGERY — CARPOMETACARPAL (CMC) FUSION OF THUMB
Anesthesia: General | Site: Thumb | Laterality: Right

## 2018-04-07 MED ORDER — PROPOFOL 10 MG/ML IV BOLUS
INTRAVENOUS | Status: AC
  Filled 2018-04-07: qty 20

## 2018-04-07 MED ORDER — PHENYLEPHRINE HCL 10 MG/ML IJ SOLN
INTRAMUSCULAR | Status: DC | PRN
  Administered 2018-04-07 (×3): 100 ug via INTRAVENOUS

## 2018-04-07 MED ORDER — CEFAZOLIN SODIUM-DEXTROSE 2-4 GM/100ML-% IV SOLN
INTRAVENOUS | Status: AC
  Filled 2018-04-07: qty 100

## 2018-04-07 MED ORDER — MIDAZOLAM HCL 2 MG/2ML IJ SOLN
INTRAMUSCULAR | Status: DC | PRN
  Administered 2018-04-07: 2 mg via INTRAVENOUS

## 2018-04-07 MED ORDER — FENTANYL CITRATE (PF) 100 MCG/2ML IJ SOLN
INTRAMUSCULAR | Status: DC | PRN
  Administered 2018-04-07: 25 ug via INTRAVENOUS
  Administered 2018-04-07: 100 ug via INTRAVENOUS
  Administered 2018-04-07: 25 ug via INTRAVENOUS
  Administered 2018-04-07: 50 ug via INTRAVENOUS

## 2018-04-07 MED ORDER — FENTANYL CITRATE (PF) 100 MCG/2ML IJ SOLN
INTRAMUSCULAR | Status: AC
  Filled 2018-04-07: qty 2

## 2018-04-07 MED ORDER — FAMOTIDINE 20 MG PO TABS
20.0000 mg | ORAL_TABLET | Freq: Once | ORAL | Status: AC
  Administered 2018-04-07: 20 mg via ORAL

## 2018-04-07 MED ORDER — FENTANYL CITRATE (PF) 100 MCG/2ML IJ SOLN
25.0000 ug | INTRAMUSCULAR | Status: DC | PRN
  Administered 2018-04-07 (×4): 25 ug via INTRAVENOUS

## 2018-04-07 MED ORDER — BUPIVACAINE HCL (PF) 0.5 % IJ SOLN
INTRAMUSCULAR | Status: DC | PRN
  Administered 2018-04-07: 10 mL

## 2018-04-07 MED ORDER — HYDROMORPHONE HCL 1 MG/ML IJ SOLN
0.5000 mg | INTRAMUSCULAR | Status: AC
  Administered 2018-04-07 (×3): 0.5 mg via INTRAVENOUS

## 2018-04-07 MED ORDER — KETOROLAC TROMETHAMINE 30 MG/ML IJ SOLN
INTRAMUSCULAR | Status: DC | PRN
  Administered 2018-04-07: 30 mg via INTRAVENOUS

## 2018-04-07 MED ORDER — ACETAMINOPHEN 10 MG/ML IV SOLN
INTRAVENOUS | Status: DC | PRN
  Administered 2018-04-07: 1000 mg via INTRAVENOUS

## 2018-04-07 MED ORDER — OXYCODONE-ACETAMINOPHEN 5-325 MG PO TABS
ORAL_TABLET | ORAL | Status: AC
  Filled 2018-04-07: qty 1

## 2018-04-07 MED ORDER — MIDAZOLAM HCL 2 MG/2ML IJ SOLN
INTRAMUSCULAR | Status: AC
  Filled 2018-04-07: qty 2

## 2018-04-07 MED ORDER — METOCLOPRAMIDE HCL 10 MG PO TABS: 5.0000 mg | ORAL_TABLET | Freq: Three times a day (TID) | ORAL | Status: DC | PRN

## 2018-04-07 MED ORDER — HYDROMORPHONE HCL 1 MG/ML IJ SOLN
INTRAMUSCULAR | Status: AC
  Filled 2018-04-07: qty 1

## 2018-04-07 MED ORDER — GELATIN ABSORBABLE 12-7 MM EX MISC
CUTANEOUS | Status: DC | PRN
  Administered 2018-04-07: 1

## 2018-04-07 MED ORDER — GENTAMICIN SULFATE 40 MG/ML IJ SOLN
INTRAMUSCULAR | Status: AC
  Filled 2018-04-07: qty 2

## 2018-04-07 MED ORDER — ACETAMINOPHEN 10 MG/ML IV SOLN
INTRAVENOUS | Status: AC
  Filled 2018-04-07: qty 100

## 2018-04-07 MED ORDER — ONDANSETRON HCL 4 MG/2ML IJ SOLN
INTRAMUSCULAR | Status: DC | PRN
  Administered 2018-04-07: 4 mg via INTRAVENOUS

## 2018-04-07 MED ORDER — LACTATED RINGERS IV SOLN
INTRAVENOUS | Status: DC
  Administered 2018-04-07 (×3): via INTRAVENOUS

## 2018-04-07 MED ORDER — SODIUM CHLORIDE 0.9 % IV SOLN
INTRAVENOUS | Status: DC | PRN
  Administered 2018-04-07: 200 mL

## 2018-04-07 MED ORDER — PROPOFOL 10 MG/ML IV BOLUS
INTRAVENOUS | Status: DC | PRN
  Administered 2018-04-07: 200 mg via INTRAVENOUS

## 2018-04-07 MED ORDER — FENTANYL CITRATE (PF) 100 MCG/2ML IJ SOLN
INTRAMUSCULAR | Status: AC
  Filled 2018-04-07: qty 4

## 2018-04-07 MED ORDER — ONDANSETRON HCL 4 MG PO TABS: 4.0000 mg | ORAL_TABLET | Freq: Four times a day (QID) | ORAL | Status: DC | PRN

## 2018-04-07 MED ORDER — FAMOTIDINE 20 MG PO TABS
ORAL_TABLET | ORAL | Status: AC
  Administered 2018-04-07: 20 mg via ORAL
  Filled 2018-04-07: qty 1

## 2018-04-07 MED ORDER — GELATIN ABSORBABLE 12-7 MM EX MISC
CUTANEOUS | Status: AC
  Filled 2018-04-07: qty 1

## 2018-04-07 MED ORDER — ONDANSETRON HCL 4 MG/2ML IJ SOLN: 4.0000 mg | Freq: Once | INTRAMUSCULAR | Status: DC | PRN

## 2018-04-07 MED ORDER — DEXAMETHASONE SODIUM PHOSPHATE 10 MG/ML IJ SOLN
INTRAMUSCULAR | Status: DC | PRN
  Administered 2018-04-07: 5 mg via INTRAVENOUS

## 2018-04-07 MED ORDER — METOCLOPRAMIDE HCL 5 MG/ML IJ SOLN: 5.0000 mg | Freq: Three times a day (TID) | INTRAMUSCULAR | Status: DC | PRN

## 2018-04-07 MED ORDER — KETOROLAC TROMETHAMINE 30 MG/ML IJ SOLN
INTRAMUSCULAR | Status: AC
  Filled 2018-04-07: qty 1

## 2018-04-07 MED ORDER — BUPIVACAINE HCL (PF) 0.5 % IJ SOLN
INTRAMUSCULAR | Status: AC
  Filled 2018-04-07: qty 30

## 2018-04-07 MED ORDER — ONDANSETRON HCL 4 MG/2ML IJ SOLN: 4.0000 mg | Freq: Four times a day (QID) | INTRAMUSCULAR | Status: DC | PRN

## 2018-04-07 MED ORDER — LIDOCAINE HCL (CARDIAC) PF 100 MG/5ML IV SOSY
PREFILLED_SYRINGE | INTRAVENOUS | Status: DC | PRN
  Administered 2018-04-07: 100 mg via INTRAVENOUS

## 2018-04-07 MED ORDER — SODIUM CHLORIDE 0.9 % IV SOLN: INTRAVENOUS | Status: DC

## 2018-04-07 MED ORDER — SEVOFLURANE IN SOLN
RESPIRATORY_TRACT | Status: AC
  Filled 2018-04-07: qty 250

## 2018-04-07 MED ORDER — OXYCODONE-ACETAMINOPHEN 5-325 MG PO TABS
1.0000 | ORAL_TABLET | ORAL | Status: DC | PRN
  Administered 2018-04-07: 1 via ORAL

## 2018-04-07 SURGICAL SUPPLY — 33 items
BANDAGE ACE 3X5.8 VEL STRL LF (GAUZE/BANDAGES/DRESSINGS) ×1 IMPLANT
BANDAGE ELASTIC 3 LF NS (GAUZE/BANDAGES/DRESSINGS) ×2 IMPLANT
BLADE OSC/SAGITTAL 5.5X25 (BLADE) ×2 IMPLANT
BNDG CONFORM 3 STRL LF (GAUZE/BANDAGES/DRESSINGS) ×2 IMPLANT
CAST PADDING 3X4FT ST 30246 (SOFTGOODS) ×1
COVER WAND RF STERILE (DRAPES) ×2 IMPLANT
CUFF TOURN 18 STER (MISCELLANEOUS) ×1 IMPLANT
DRAPE FLUOR MINI C-ARM 54X84 (DRAPES) ×2 IMPLANT
ELECT CAUTERY BLADE 6.4 (BLADE) ×2 IMPLANT
GAUZE PETRO XEROFOAM 1X8 (MISCELLANEOUS) ×2 IMPLANT
GAUZE SPONGE 4X4 12PLY STRL (GAUZE/BANDAGES/DRESSINGS) ×2 IMPLANT
GAUZE XEROFORM 4X4 STRL (GAUZE/BANDAGES/DRESSINGS) ×1 IMPLANT
GLOVE SURG SYN 9.0  PF PI (GLOVE) ×1
GLOVE SURG SYN 9.0 PF PI (GLOVE) ×1 IMPLANT
GOWN SRG 2XL LVL 4 RGLN SLV (GOWNS) ×1 IMPLANT
GOWN STRL NON-REIN 2XL LVL4 (GOWNS) ×1
GOWN STRL REUS W/ TWL LRG LVL3 (GOWN DISPOSABLE) ×1 IMPLANT
GOWN STRL REUS W/TWL LRG LVL3 (GOWN DISPOSABLE) ×1
KIT TURNOVER KIT A (KITS) ×2 IMPLANT
NS IRRIG 500ML POUR BTL (IV SOLUTION) ×2 IMPLANT
PACK EXTREMITY ARMC (MISCELLANEOUS) ×2 IMPLANT
PAD CAST CTTN 3X4 STRL (SOFTGOODS) ×1 IMPLANT
SCALPEL PROTECTED #15 DISP (BLADE) ×4 IMPLANT
SPLINT CAST 1 STEP 3X12 (MISCELLANEOUS) ×2 IMPLANT
SPLINT WRIST M LT TX990308 (SOFTGOODS) ×2 IMPLANT
SUT ETHILON 4-0 (SUTURE) ×1
SUT ETHILON 4-0 FS2 18XMFL BLK (SUTURE) ×1
SUT VIC AB 0 CT2 27 (SUTURE) ×4 IMPLANT
SUT VIC AB 3-0 SH 27 (SUTURE) ×1
SUT VIC AB 3-0 SH 27X BRD (SUTURE) ×1 IMPLANT
SUTURE ETHLN 4-0 FS2 18XMF BLK (SUTURE) ×1 IMPLANT
SYSTEM IMPLANT TIGHTROPE MINI (Anchor) ×3 IMPLANT
WIRE Z .062 C-WIRE SPADE TIP (WIRE) ×4 IMPLANT

## 2018-04-07 NOTE — Op Note (Signed)
04/07/2018  4:31 PM  PATIENT:  John James  48 y.o. male  PRE-OPERATIVE DIAGNOSIS:  SUBLUXATION CARPOMETACARPAL JOINT, RIGHT THUMB  POST-OPERATIVE DIAGNOSIS:  SUBLUXATION CARPOMETACARPAL JOINT,   PROCEDURE:  Procedure(s): CARPOMETACARPAL (CMC) ARHTROPLASTY OF THUMB (Right)  SURGEON: Leitha Schuller, MD  ASSISTANTS: none  ANESTHESIA:   general  EBL:  Total I/O In: 1200 [I.V.:1200] Out: 3 [Blood:3]  BLOOD ADMINISTERED:none  DRAINS: none   LOCAL MEDICATIONS USED:  MARCAINE     SPECIMEN:  No Specimen  DISPOSITION OF SPECIMEN:  N/A  COUNTS:  YES  TOURNIQUET:   Total Tourniquet Time Documented: Upper Arm (Right) - 89 minutes Total: Upper Arm (Right) - 89 minutes   IMPLANTS: mini tightrope x 1  DICTATION: .Dragon Dictation patient was brought to the operating room and after adequate general anesthesia was obtained, the right arm was prepped and draped in the usual sterile fashion.  After patient identification and timeout procedure were completed, tourniquet was raised and an incision made along the border between the volar and dorsal skin from the base of the first metacarpal proximally proximally 1 inch.  Again superficial nerves were preserved as much as possible.  The capsule was identified and opened and elevated to expose the trapezium which was then removed using the corkscrew to try to remove it in 1 block however it did come apart and it was quartered and removed after this was completed a guidewire was inserted from the base of the first metacarpal across to the second metacarpal with a mini tight rope pulled through this tunnel.  The thumb was held in the appropriate position and the distal device placed through the mini tight rope and tightened provisionally.  There was no pistoning and there is good range of motion of the thumb and so this with this provisional fixation was determined to be appropriate and further sutures were placed the sutures cut short and the  device buried.  On the [facility] joint side the defect was filled with Gelfoam and the capsule repaired with 0 Vicryl followed by a 3-0 Vicryl subcutaneous and a 4-0 nylon skin closure additional 10 cc of half percent Sensorcaine was infiltrated around the incision for postop analgesia the tourniquet was let down without significant bleeding noted.  Xeroform 4 x 4's web roll and a thumb spica splint were applied followed by an Ace wrap patient sent to recovery in stable condition  PLAN OF CARE: Discharge to home after PACU  PATIENT DISPOSITION:  PACU - hemodynamically stable.

## 2018-04-07 NOTE — Discharge Instructions (Addendum)
AMBULATORY SURGERY  DISCHARGE INSTRUCTIONS   1) The drugs that you were given will stay in your system until tomorrow so for the next 24 hours you should not:  A) Drive an automobile B) Make any legal decisions C) Drink any alcoholic beverage   2) You may resume regular meals tomorrow.  Today it is better to start with liquids and gradually work up to solid foods.  You may eat anything you prefer, but it is better to start with liquids, then soup and crackers, and gradually work up to solid foods.   3) Please notify your doctor immediately if you have any unusual bleeding, trouble breathing, redness and pain at the surgery site, drainage, fever, or pain not relieved by medication. 4)   5) Your post-operative visit with Dr.                                     is: Date:                        Time:    Please call to schedule your post-operative visit.  6) Additional Instructions:      Arm elevated as much as possible.  Ice to the back of the wrist.  Work on finger range of motion except the thumb, do not try to move the thumb.  Pain medicine as directed.

## 2018-04-07 NOTE — Transfer of Care (Signed)
Immediate Anesthesia Transfer of Care Note  Patient: John James  Procedure(s) Performed: CARPOMETACARPAL (CMC) ARHTROPLASTY OF THUMB (Right Thumb)  Patient Location: PACU  Anesthesia Type:General  Level of Consciousness: sedated  Airway & Oxygen Therapy: Patient Spontanous Breathing and Patient connected to face mask oxygen  Post-op Assessment: Report given to RN and Post -op Vital signs reviewed and stable  Post vital signs: Reviewed and stable  Last Vitals:  Vitals Value Taken Time  BP 124/90 04/07/2018  4:36 PM  Temp    Pulse 77 04/07/2018  4:38 PM  Resp 15 04/07/2018  4:38 PM  SpO2 98 % 04/07/2018  4:38 PM  Vitals shown include unvalidated device data.  Last Pain:  Vitals:   04/07/18 1209  TempSrc: Temporal  PainSc: 0-No pain         Complications: No apparent anesthesia complications

## 2018-04-07 NOTE — Anesthesia Postprocedure Evaluation (Signed)
Anesthesia Post Note  Patient: John James  Procedure(s) Performed: CARPOMETACARPAL (CMC) ARHTROPLASTY OF THUMB (Right Thumb)  Patient location during evaluation: PACU Anesthesia Type: General Level of consciousness: awake and alert Pain management: pain level controlled Vital Signs Assessment: post-procedure vital signs reviewed and stable Respiratory status: spontaneous breathing, nonlabored ventilation, respiratory function stable and patient connected to nasal cannula oxygen Cardiovascular status: blood pressure returned to baseline and stable Postop Assessment: no apparent nausea or vomiting Anesthetic complications: no     Last Vitals:  Vitals:   04/07/18 1738 04/07/18 1805  BP: (!) 143/94 (!) 144/90  Pulse: 84 95  Resp: 16   Temp: (!) 36.3 C   SpO2: 96% 98%    Last Pain:  Vitals:   04/07/18 1805  TempSrc:   PainSc: 3                  Jovita GammaKathryn L Fitzgerald

## 2018-04-07 NOTE — H&P (Signed)
Reviewed paper H+P, will be scanned into chart. No changes noted.  

## 2018-04-07 NOTE — Anesthesia Preprocedure Evaluation (Signed)
Anesthesia Evaluation  Patient identified by MRN, date of birth, ID band Patient awake    Reviewed: Allergy & Precautions, NPO status , Patient's Chart, lab work & pertinent test results  History of Anesthesia Complications Negative for: history of anesthetic complications  Airway Mallampati: II       Dental  (+) Teeth Intact   Pulmonary sleep apnea and Continuous Positive Airway Pressure Ventilation , neg COPD,    Pulmonary exam normal        Cardiovascular hypertension, Pt. on medications Normal cardiovascular exam+ Valvular Problems/Murmurs      Neuro/Psych neg Seizures negative neurological ROS  negative psych ROS   GI/Hepatic Neg liver ROS, GERD  Controlled,  Endo/Other  neg diabetes  Renal/GU negative Renal ROS  negative genitourinary   Musculoskeletal  (+) Arthritis ,   Abdominal Normal abdominal exam  (+)   Peds negative pediatric ROS (+)  Hematology   Anesthesia Other Findings   Reproductive/Obstetrics                             Anesthesia Physical  Anesthesia Plan  ASA: III  Anesthesia Plan: General   Post-op Pain Management:    Induction:   PONV Risk Score and Plan:   Airway Management Planned: LMA  Additional Equipment:   Intra-op Plan:   Post-operative Plan: Extubation in OR  Informed Consent: I have reviewed the patients History and Physical, chart, labs and discussed the procedure including the risks, benefits and alternatives for the proposed anesthesia with the patient or authorized representative who has indicated his/her understanding and acceptance.       Plan Discussed with:   Anesthesia Plan Comments:         Anesthesia Quick Evaluation

## 2018-04-07 NOTE — Anesthesia Post-op Follow-up Note (Signed)
Anesthesia QCDR form completed.        

## 2018-04-19 ENCOUNTER — Encounter: Payer: Self-pay | Admitting: Orthopedic Surgery

## 2018-04-29 ENCOUNTER — Ambulatory Visit: Payer: BLUE CROSS/BLUE SHIELD | Admitting: Occupational Therapy

## 2018-05-06 ENCOUNTER — Ambulatory Visit: Payer: BLUE CROSS/BLUE SHIELD | Admitting: Occupational Therapy

## 2018-05-09 ENCOUNTER — Other Ambulatory Visit: Payer: Self-pay

## 2018-05-09 ENCOUNTER — Encounter: Payer: Self-pay | Admitting: Occupational Therapy

## 2018-05-09 ENCOUNTER — Ambulatory Visit: Payer: BLUE CROSS/BLUE SHIELD | Attending: Orthopedic Surgery | Admitting: Occupational Therapy

## 2018-05-09 DIAGNOSIS — M25641 Stiffness of right hand, not elsewhere classified: Secondary | ICD-10-CM | POA: Diagnosis present

## 2018-05-09 DIAGNOSIS — R208 Other disturbances of skin sensation: Secondary | ICD-10-CM

## 2018-05-09 DIAGNOSIS — M25631 Stiffness of right wrist, not elsewhere classified: Secondary | ICD-10-CM | POA: Diagnosis present

## 2018-05-09 DIAGNOSIS — M6281 Muscle weakness (generalized): Secondary | ICD-10-CM | POA: Diagnosis present

## 2018-05-09 DIAGNOSIS — M79641 Pain in right hand: Secondary | ICD-10-CM | POA: Diagnosis not present

## 2018-05-09 NOTE — Therapy (Signed)
[facility] [address], [place], [address] Phone: [phone]   Fax:  [phone]  Occupational Therapy Evaluation  Patient Details  Name: John James MRN: [id number] Date of Birth: Jul 08, 1970 No data recorded  Encounter Date: 05/09/2018  OT End of Session - 05/09/18 1743    Visit Number  1    Number of Visits  14    Date for OT Re-Evaluation  07/04/18    OT Start Time  1050    OT Stop Time  1142    OT Time Calculation (min)  52 min    Activity Tolerance  Patient tolerated treatment well    Behavior During Therapy  [facility] for tasks assessed/performed       Past Medical History:  Diagnosis Date  . Arthritis   . Back pain   . Erectile dysfunction   . GERD (gastroesophageal reflux disease)   . Heart murmur    as a child  . History of MRSA infection 2016   left forearm from spider bite  . Hypercholesteremia   . Hypertension   . Sleep apnea     Past Surgical History:  Procedure Laterality Date  . CARPOMETACARPAL (CMC) FUSION OF THUMB Right 04/07/2018   Procedure: CARPOMETACARPAL (CMC) ARHTROPLASTY OF THUMB;  Surgeon: Kennedy Bucker, MD;  Location: [facility] ORS;  Service: Orthopedics;  Laterality: Right;  . CERVICAL FUSION  11/2016  . ELBOW SURGERY Left   . FRACTURE SURGERY    . KNEE ARTHROSCOPY WITH MEDIAL MENISECTOMY Left 01/13/2018   Procedure: KNEE ARTHROSCOPY WITH MEDIAL MENISECTOMY;  Surgeon: Kennedy Bucker, MD;  Location: [facility] ORS;  Service: Orthopedics;  Laterality: Left;  . LAPAROSCOPIC GASTRIC BYPASS    . SHOULDER SURGERY Left   . TONSILLECTOMY      There were no vitals filed for this visit.  Subjective Assessment - 05/09/18 1730    Subjective   DOing okay- I miss last Friday eval with you because of snow - but thumb hurts when I try and do something - keeping my splint still one about all the time     Patient Stated Goals  Want to be able to use my R hand and thumb - I like still to  shoot at shooting range, some yardwork , working out at the gym     Currently in Pain?  Yes    Pain Score  3     Pain Location  Hand    Pain Orientation  Right    Pain Descriptors / Indicators  Aching;Tightness;Numbness    Pain Type  Surgical pain    Pain Onset  More than a month ago    Pain Frequency  Constant        [facility] OT Assessment - 05/09/18 0001      AROM   Right Wrist Extension  50 Degrees    Right Wrist Flexion  76 Degrees    Right Wrist Radial Deviation  16 Degrees    Right Wrist Ulnar Deviation  34 Degrees    Left Wrist Extension  75 Degrees    Left Wrist Flexion  95 Degrees    Left Wrist Radial Deviation  35 Degrees    Left Wrist Ulnar Deviation  33 Degrees      Right Hand AROM   R Thumb MCP 0-60  40 Degrees    R Thumb IP 0-80  60 Degrees    R Thumb Radial ABduction/ADduction 0-55  48    R  Thumb Palmar ABduction/ADduction 0-45  48    R Thumb Opposition to Index  --   to 2nd fold of 5th    R Index  MCP 0-90  80 Degrees    R Long  MCP 0-90  80 Degrees    R Ring  MCP 0-90  90 Degrees    R Little  MCP 0-90  90 Degrees      Left Hand AROM   L Thumb MCP 0-60  62 Degrees    L Thumb IP 0-80  80 Degrees    L Thumb Radial ADduction/ABduction 0-55  55    L Thumb Palmar ADduction/ABduction 0-45  58         fluidotherapy done for AROM prior to review of HEP       Contrast   splint off when sitting - but on with anything more than lbs  Scar massage and cica scar pad at night time  Do different textures over scar  Wrist AROM for RD, UD, Flexion and extention - pain free  Tendon glides   block IP and MP flexion of thumb  PA and RA AROM  Thumb circles - both ways  And opposition - slide down 5th  All slight pull - but no pain more than 1-2/10  Can do ice at end again  8-10 reps  2-3 x day         OT Education - 05/09/18 1742    Education Details  findings of eval , Protocol and HEP     Person(s) Educated  Patient    Methods   Explanation;Demonstration;Verbal cues;Handout    Comprehension  Verbalized understanding;Returned demonstration       OT Short Term Goals - 05/09/18 1748      OT SHORT TERM GOAL #1   Title  Pt to be ind in perfroming and progressing in AROM for thumb and wrist to [facility] with pain less than 1-2/10     Baseline  no knowledge of HEP- pain 3-8/10 - in splint     Time  3    Period  Weeks    Status  New    Target Date  05/30/18      OT SHORT TERM GOAL #2   Title  Pain on PRWHE improve with more than 20 points     Baseline  pain at eval on PRWHE 37/50    Time  3    Period  Weeks    Status  New    Target Date  05/30/18        OT Long Term Goals - 05/09/18 1749      OT LONG TERM GOAL #1   Title  Pt able to participate in strengthening HEP for thumb and wrist to be able to wean out of thumb spica to wear less than 25 % of time     Baseline  no strenghtening yet - splint on more than 75% of time and sleeping     Time  4    Period  Weeks    Status  New    Target Date  06/06/18      OT LONG TERM GOAL #2   Title  Strength in R  thumb PA , RA and wrist in all planes 5/5 without pain to increase use on function scale on PRWHE by more than 15 points     Baseline  no strength yet- AROM decreased- function score on PRWHE 23.5/50 at eval  Time  8    Period  Weeks    Status  New    Target Date  07/04/18      OT LONG TERM GOAL #3   Title  Grip strengthn and prehension improve with more than 50% compare to L hand for pt to return to shooting     Baseline  NT- pt only  4 1/2 wks s/p     Time  8    Period  Weeks    Status  New    Target Date  07/04/18            Plan - 05/09/18 1743    Clinical Impression Statement  Pt present at OT eval with diagnosis of R CMC arthroplasty - pt 4 1/2 wsk s/p - pt show increase pain at rest in splint 3/10 and at the most the last week 8/10 - reinforce purpose of surgery - pain control - to advance ROM and strength without increase pain -pt show  decrease AROM at digits and wrist , increase pain and decrease strength- all limiting his functional use of R dominant hand in ADL's and IADl's     OT Occupational Profile and History  Problem Focused Assessment - Including review of records relating to presenting problem    Occupational performance deficits (Please refer to evaluation for details):  ADL's;IADL's;Leisure;Social Participation;Play    Pt will benefit from skilled therapeutic intervention in order to improve on the following performance deficits  Body Structure / Function / Physical Skills    Body Structure / Function / Physical Skills  ADL;Flexibility;ROM;Strength;Coordination;Edema;Scar mobility;Pain;Sensation;UE functional use;Decreased knowledge of precautions;FMC    Rehab Potential  Good    Clinical Decision Making  Several treatment options, min-mod task modification necessary    OT Frequency  --   1-2 x wks   OT Duration  8 weeks    OT Treatment/Interventions  Self-care/ADL training;Paraffin;Therapeutic exercise;Splinting;Scar mobilization;Manual Therapy;Fluidtherapy;Contrast Bath;Passive range of motion;Patient/family education    Plan  assess progress with HEP     OT Home Exercise Plan  see pt instruction     Consulted and Agree with Plan of Care  Patient       Patient will benefit from skilled therapeutic intervention in order to improve the following deficits and impairments:     Visit Diagnosis: Pain in right hand - Plan: Ot plan of care cert/re-cert  Stiffness of right hand, not elsewhere classified - Plan: Ot plan of care cert/re-cert  Stiffness of right wrist, not elsewhere classified - Plan: Ot plan of care cert/re-cert  Muscle weakness (generalized) - Plan: Ot plan of care cert/re-cert  Other disturbances of skin sensation - Plan: Ot plan of care cert/re-cert    Problem List There are no active problems to display for this patient.   Aleka Twitty OTR/L, CLT 05/09/2018, 6:11 PM  [facility] [address] [place], [address] Phone: [phone]   Fax:  [phone]  Name: John James MRN: [id number] Date of Birth: [birth year]

## 2018-05-09 NOTE — Patient Instructions (Signed)
Contrast   splint off when sitting - but on with anything more than lbs  Scar massage and cica scar pad at night time  Do different textures over scar  Wrist AROM for RD, UD, Flexion and extention - pain free  Tendon glides   block IP and MP flexion of thumb  PA and RA AROM  Thumb circles - both ways  And opposition - slide down 5th  All slight pull - but no pain more than 1-2/10  Can do ice at end again  8-10 reps  2-3 x day

## 2018-05-12 ENCOUNTER — Ambulatory Visit: Payer: BLUE CROSS/BLUE SHIELD | Admitting: Occupational Therapy

## 2018-05-12 DIAGNOSIS — M79641 Pain in right hand: Secondary | ICD-10-CM

## 2018-05-12 DIAGNOSIS — M25631 Stiffness of right wrist, not elsewhere classified: Secondary | ICD-10-CM

## 2018-05-12 DIAGNOSIS — M25641 Stiffness of right hand, not elsewhere classified: Secondary | ICD-10-CM

## 2018-05-12 DIAGNOSIS — R208 Other disturbances of skin sensation: Secondary | ICD-10-CM

## 2018-05-12 DIAGNOSIS — M6281 Muscle weakness (generalized): Secondary | ICD-10-CM

## 2018-05-12 NOTE — Therapy (Signed)
[facility] [address] [place], [address] Phone: [phone]   Fax:  [phone]  Occupational Therapy Treatment  Patient Details  Name: John James MRN: [id number] Date of Birth: [birth year] No data recorded  Encounter Date: 05/12/2018  OT End of Session - 05/12/18 1354    Visit Number  2    Number of Visits  14    Date for OT Re-Evaluation  07/04/18    OT Start Time  1115    OT Stop Time  1158    OT Time Calculation (min)  43 min    Activity Tolerance  Patient tolerated treatment well    Behavior During Therapy  [facility] for tasks assessed/performed       Past Medical History:  Diagnosis Date  . Arthritis   . Back pain   . Erectile dysfunction   . GERD (gastroesophageal reflux disease)   . Heart murmur    as a child  . History of MRSA infection 2016   left forearm from spider bite  . Hypercholesteremia   . Hypertension   . Sleep apnea     Past Surgical History:  Procedure Laterality Date  . CARPOMETACARPAL (CMC) FUSION OF THUMB Right 04/07/2018   Procedure: CARPOMETACARPAL (CMC) ARHTROPLASTY OF THUMB;  Surgeon: Kennedy Bucker, MD;  Location: [facility] ORS;  Service: Orthopedics;  Laterality: Right;  . CERVICAL FUSION  11/2016  . ELBOW SURGERY Left   . FRACTURE SURGERY    . KNEE ARTHROSCOPY WITH MEDIAL MENISECTOMY Left 01/13/2018   Procedure: KNEE ARTHROSCOPY WITH MEDIAL MENISECTOMY;  Surgeon: Kennedy Bucker, MD;  Location: [facility] ORS;  Service: Orthopedics;  Laterality: Left;  . LAPAROSCOPIC GASTRIC BYPASS    . SHOULDER SURGERY Left   . TONSILLECTOMY      There were no vitals filed for this visit.  Subjective Assessment - 05/12/18 1344    Subjective   Doing better- did the exercises - little bit of soreness but no pain - and in splint no pain - did keep it off about 50% during day while watching tv     Patient Stated Goals  Want to be able to use my R hand and thumb - I like still to shoot at  shooting range, some yardwork , working out at the gym     Currently in Pain?  No/denies         [facility] OT Assessment - 05/12/18 0001      Right Hand AROM   R Thumb MCP 0-60  45 Degrees    R Thumb IP 0-80  60 Degrees    R Thumb Radial ABduction/ADduction 0-55  50    R Thumb Palmar ABduction/ADduction 0-45  50    R Thumb Opposition to Index  --   opposition to base of 5th               OT Treatments/Exercises (OP) - 05/12/18 0001      Cryotherapy   Number Minutes Cryotherapy  4 Minutes    Cryotherapy Location  Hand;Wrist   end of session   Type of Cryotherapy  Ice pack      RUE Fluidotherapy   Number Minutes Fluidotherapy  8 Minutes    RUE Fluidotherapy Location  Hand;Wrist    Comments  AROM for thumb and wrist in all planes prior to session       Soft tissue mobs done to webspace pain free  Volar forearm and wrist -  Kinesiotape  done to scar on CMC 30% parallel and 2 across - 100 %  To wear most all the time - take of if any issues or when coming loose Cont scar massage      Add this date AAROM for wrist flexion, ext, RD, UD pain free prior to AROM  10 reps Review Wrist AROM for RD, UD, Flexion and extention - pain free  Tendon glides   block IP and MP flexion of thumb  PA and RA AROM  Thumb circles - both ways  And opposition - slide down base of  5th  slight pull - but no pain more than 1-2/10   ice at end  10 reps   Cont with HEP same as last time and AAROM for wrist      OT Education - 05/12/18 1354    Education Details  review HEP and add wrist AAROM     Person(s) Educated  Patient    Methods  Explanation;Demonstration;Verbal cues;Handout    Comprehension  Verbalized understanding;Returned demonstration       OT Short Term Goals - 05/09/18 1748      OT SHORT TERM GOAL #1   Title  Pt to be ind in perfroming and progressing in AROM for thumb and wrist to [facility] with pain less than 1-2/10     Baseline  no knowledge of HEP- pain 3-8/10 - in  splint     Time  3    Period  Weeks    Status  New    Target Date  05/30/18      OT SHORT TERM GOAL #2   Title  Pain on PRWHE improve with more than 20 points     Baseline  pain at eval on PRWHE 37/50    Time  3    Period  Weeks    Status  New    Target Date  05/30/18        OT Long Term Goals - 05/09/18 1749      OT LONG TERM GOAL #1   Title  Pt able to participate in strengthening HEP for thumb and wrist to be able to wean out of thumb spica to wear less than 25 % of time     Baseline  no strenghtening yet - splint on more than 75% of time and sleeping     Time  4    Period  Weeks    Status  New    Target Date  06/06/18      OT LONG TERM GOAL #2   Title  Strength in R  thumb PA , RA and wrist in all planes 5/5 without pain to increase use on function scale on PRWHE by more than 15 points     Baseline  no strength yet- AROM decreased- function score on PRWHE 23.5/50 at eval     Time  8    Period  Weeks    Status  New    Target Date  07/04/18      OT LONG TERM GOAL #3   Title  Grip strengthn and prehension improve with more than 50% compare to L hand for pt to return to shooting     Baseline  NT- pt only  4 1/2 wks s/p     Time  8    Period  Weeks    Status  New    Target Date  07/04/18            Plan -  05/12/18 1356    Clinical Impression Statement  Pt is 5 wks s/p R CMC arthroplasty - pt progressing with AROM for thumb and wrist - add this date pain free AAROM for wrist - and to cont wearing splint most alll time time when up     OT Occupational Profile and History  Problem Focused Assessment - Including review of records relating to presenting problem    Occupational performance deficits (Please refer to evaluation for details):  ADL's;IADL's;Leisure;Social Participation;Play    Body Structure / Function / Physical Skills  ADL;Flexibility;ROM;Strength;Coordination;Edema;Scar mobility;Pain;Sensation;UE functional use;Decreased knowledge of precautions;FMC     Rehab Potential  Good    Clinical Decision Making  Several treatment options, min-mod task modification necessary    OT Frequency  --   1-2 x wk   OT Duration  8 weeks    OT Treatment/Interventions  Self-care/ADL training;Paraffin;Therapeutic exercise;Splinting;Scar mobilization;Manual Therapy;Fluidtherapy;Contrast Bath;Passive range of motion;Patient/family education    Plan  assess progress with HEP     OT Home Exercise Plan  see pt instruction     Consulted and Agree with Plan of Care  Patient       Patient will benefit from skilled therapeutic intervention in order to improve the following deficits and impairments:  Body Structure / Function / Physical Skills  Visit Diagnosis: Pain in right hand  Stiffness of right hand, not elsewhere classified  Stiffness of right wrist, not elsewhere classified  Muscle weakness (generalized)  Other disturbances of skin sensation    Problem List There are no active problems to display for this patient.   Oletta Cohn OTR/l,CLT 05/12/2018, 1:58 PM  [facility] [facility] SPORTS [facility]ICINE [address], [place], [address] Phone: [phone]   Fax:  [phone]  Name: John James MRN: [id number] Date of Birth: Mar 28, 1970

## 2018-05-12 NOTE — Patient Instructions (Signed)
Add AAROM for wrist flexion ,ext, RD, UD - 10 reps  prior to AROM   Light rolling hand /palm over foam roller

## 2018-05-16 ENCOUNTER — Encounter: Payer: Self-pay | Admitting: Occupational Therapy

## 2018-05-17 ENCOUNTER — Ambulatory Visit: Payer: BLUE CROSS/BLUE SHIELD | Admitting: Occupational Therapy

## 2018-05-19 ENCOUNTER — Other Ambulatory Visit: Payer: Self-pay

## 2018-05-19 ENCOUNTER — Ambulatory Visit: Payer: BLUE CROSS/BLUE SHIELD | Admitting: Occupational Therapy

## 2018-05-19 DIAGNOSIS — M79641 Pain in right hand: Secondary | ICD-10-CM | POA: Diagnosis not present

## 2018-05-19 DIAGNOSIS — M25641 Stiffness of right hand, not elsewhere classified: Secondary | ICD-10-CM

## 2018-05-19 DIAGNOSIS — M25631 Stiffness of right wrist, not elsewhere classified: Secondary | ICD-10-CM

## 2018-05-19 DIAGNOSIS — R208 Other disturbances of skin sensation: Secondary | ICD-10-CM

## 2018-05-19 DIAGNOSIS — M6281 Muscle weakness (generalized): Secondary | ICD-10-CM

## 2018-05-19 NOTE — Therapy (Signed)
[place] [facility] PHYSICAL AND SPORTS MEDICINE [address] [place], [address] Phone: [phone]   Fax:  [phone]  Occupational Therapy Treatment  Patient Details  Name: John James MRN: [id number] Date of Birth: 26-Feb-1971 No data recorded  Encounter Date: 05/19/2018  OT End of Session - 05/19/18 2045    Visit Number  3    Number of Visits  14    Date for OT Re-Evaluation  07/04/18    OT Start Time  1150    OT Stop Time  1233    OT Time Calculation (min)  43 min    Activity Tolerance  Patient tolerated treatment well    Behavior During Therapy  [facility] for tasks assessed/performed       Past Medical History:  Diagnosis Date  . Arthritis   . Back pain   . Erectile dysfunction   . GERD (gastroesophageal reflux disease)   . Heart murmur    as a child  . History of MRSA infection 2016   left forearm from spider bite  . Hypercholesteremia   . Hypertension   . Sleep apnea     Past Surgical History:  Procedure Laterality Date  . CARPOMETACARPAL (CMC) FUSION OF THUMB Right 04/07/2018   Procedure: CARPOMETACARPAL (CMC) ARHTROPLASTY OF THUMB;  Surgeon: Kennedy Bucker, MD;  Location: [facility] ORS;  Service: Orthopedics;  Laterality: Right;  . CERVICAL FUSION  11/2016  . ELBOW SURGERY Left   . FRACTURE SURGERY    . KNEE ARTHROSCOPY WITH MEDIAL MENISECTOMY Left 01/13/2018   Procedure: KNEE ARTHROSCOPY WITH MEDIAL MENISECTOMY;  Surgeon: Kennedy Bucker, MD;  Location: [facility] OR[facility];  Service: Orthopedics;  Laterality: Left;  . LAPAROSCOPIC GASTRIC BYPASS    . SHOULDER SURGERY Left   . TONSILLECTOMY      There were no vitals filed for this visit.  Subjective Assessment - 05/19/18 2040    Subjective   Seen the Dr yesterday - tried to keep my splint off this am but my thumbs start hurting - but otherwise doing okay     Patient Stated Goals  Want to be able to use my R hand and thumb - I like still to shoot at shooting range, some yardwork ,  working out at the gym     Currently in Pain?  Yes    Pain Score  2     Pain Location  Hand    Pain Orientation  Right    Pain Descriptors / Indicators  Aching    Pain Type  Surgical pain    Pain Onset  More than a month ago    Pain Frequency  Constant      Measurements taken for AROM of thumb and wrist- progressing very well -see flowsheet    [facility] OT Assessment - 05/19/18 0001      AROM   Right Wrist Extension  60 Degrees    Right Wrist Flexion  88 Degrees    Left Wrist Flexion  --      Right Hand AROM   R Thumb MCP 0-60  48 Degrees    R Thumb IP 0-80  60 Degrees    R Thumb Radial ABduction/ADduction 0-55  50    R Thumb Palmar ABduction/ADduction 0-45  55    R Thumb Opposition to Index  --   Opposition to base of 5th              OT Treatments/Exercises (OP) - 05/19/18 0001  RUE Fluidotherapy   Number Minutes Fluidotherapy  8 Minutes    RUE Fluidotherapy Location  Hand;Wrist    Comments  AROM for thumb and wrist in all planes prior to soft tissue      Soft tissue mobs done to webspace pain free  Volar forearm and wrist -  Kinesiotape done to scar on CMC 30% parallel and 2 across - 100 %  To wear most all the time - take of if any issues or when coming loose and provided extra to do at home  Cont scar massage  And provided new CIca scar pad    ADD PROM for  wrist flexion, ext, RD, UD pain free over edge of table  10 reps 16 oz hammer or 1 lbs for wrist in all planes - pain free   PROM for IP and MP of thumb done and add to HEP  AROM IP and MP flexion of thumb  PA and RA AROM  -PROM attempted but pt hyper extend at MP of thumb  Thumb circles - both ways  And opposition - slide down base of  5th  slight pull - but no pain more than 1-2/10   Done isometric for thumb in all planes - 12 -15 reps  And pain free      Add this date PROM for wrist flexion ,ext, RD, UD over edge of table  10 reps pain free  16 oz hammer or 1 lbs for wrist in all planes -  pain free  Isometric for thumb in all planes - 1 0reps  2 x day  Can increase to 2 sets in 3 days and 3 sets in 6 days  Cont scar massage , AROM and PROM to thumb and digits  And kinesiotape      OT Education - 05/19/18 2045    Education Details  upgrade to HEP strengthening and PROM     Person(s) Educated  Patient    Methods  Explanation;Demonstration;Verbal cues;Handout    Comprehension  Verbalized understanding;Returned demonstration       OT Short Term Goals - 05/09/18 1748      OT SHORT TERM GOAL #1   Title  Pt to be ind in perfroming and progressing in AROM for thumb and wrist to [facility] with pain less than 1-2/10     Baseline  no knowledge of HEP- pain 3-8/10 - in splint     Time  3    Period  Weeks    Status  New    Target Date  05/30/18      OT SHORT TERM GOAL #2   Title  Pain on PRWHE improve with more than 20 points     Baseline  pain at eval on PRWHE 37/50    Time  3    Period  Weeks    Status  New    Target Date  05/30/18        OT Long Term Goals - 05/09/18 1749      OT LONG TERM GOAL #1   Title  Pt able to participate in strengthening HEP for thumb and wrist to be able to wean out of thumb spica to wear less than 25 % of time     Baseline  no strenghtening yet - splint on more than 75% of time and sleeping     Time  4    Period  Weeks    Status  New    Target Date  06/06/18  OT LONG TERM GOAL #2   Title  Strength in R  thumb PA , RA and wrist in all planes 5/5 without pain to increase use on function scale on PRWHE by more than 15 points     Baseline  no strength yet- AROM decreased- function score on PRWHE 23.5/50 at eval     Time  8    Period  Weeks    Status  New    Target Date  07/04/18      OT LONG TERM GOAL #3   Title  Grip strengthn and prehension improve with more than 50% compare to L hand for pt to return to shooting     Baseline  NT- pt only  4 1/2 wks s/p     Time  8    Period  Weeks    Status  New    Target Date  07/04/18             Plan - 05/19/18 2046    Clinical Impression Statement  Pt is 6 wks s/p R CMC arthroplasty - pt making great progress in AROM for thumb and wrist - but had some increase pain with not wearing thumb spica - pt ed on weaning gradually out of it - 2 hrs on and off , and add this date PROM to wrist and thumb - and strengthening     OT Occupational Profile and History  Problem Focused Assessment - Including review of records relating to presenting problem    Occupational performance deficits (Please refer to evaluation for details):  ADL's;IADL's;Leisure;Social Participation;Play    Body Structure / Function / Physical Skills  ADL;Flexibility;ROM;Strength;Coordination;Edema;Scar mobility;Pain;Sensation;UE functional use;Decreased knowledge of precautions;FMC    Rehab Potential  Good    Clinical Decision Making  Several treatment options, min-mod task modification necessary    OT Frequency  --   1-2 x wks   OT Duration  6 weeks    OT Treatment/Interventions  Self-care/ADL training;Paraffin;Therapeutic exercise;Splinting;Scar mobilization;Manual Therapy;Fluidtherapy;Contrast Bath;Passive range of motion;Patient/family education    Plan  assess progress with HEP     OT Home Exercise Plan  see pt instruction     Consulted and Agree with Plan of Care  Patient       Patient will benefit from skilled therapeutic intervention in order to improve the following deficits and impairments:  Body Structure / Function / Physical Skills  Visit Diagnosis: Pain in right hand  Stiffness of right hand, not elsewhere classified  Stiffness of right wrist, not elsewhere classified  Muscle weakness (generalized)  Other disturbances of skin sensation    Problem List There are no active problems to display for this patient.   Oletta Cohn OTR/L,CLT 05/19/2018, 8:52 PM  [place] [facility] PHYSICAL AND SPORTS MEDICINE [address] [place],  [address] Phone: [phone]   Fax:  [phone]  Name: Yazen Charvat MRN: [id number] Date of Birth: 1970/12/21

## 2018-05-19 NOTE — Patient Instructions (Signed)
Add this date PROM for wrist flexion ,ext, RD, UD over edge of table  10 reps pain free  16 oz hammer or 1 lbs for wrist in all planes - pain free  Isometric for thumb in all planes - 1 0reps  2 x day  Can increase to 2 sets in 3 days and 3 sets in 6 days  Cont scar massage , AROM and PROM to thumb and digits  And kinesiotape

## 2018-05-26 ENCOUNTER — Ambulatory Visit: Payer: BLUE CROSS/BLUE SHIELD | Admitting: Occupational Therapy

## 2018-05-30 ENCOUNTER — Encounter: Payer: Self-pay | Admitting: Occupational Therapy

## 2018-05-31 ENCOUNTER — Ambulatory Visit: Payer: BLUE CROSS/BLUE SHIELD

## 2018-06-02 ENCOUNTER — Ambulatory Visit: Payer: BLUE CROSS/BLUE SHIELD

## 2018-06-07 ENCOUNTER — Other Ambulatory Visit: Payer: Self-pay

## 2018-06-07 ENCOUNTER — Ambulatory Visit: Payer: BLUE CROSS/BLUE SHIELD | Admitting: Occupational Therapy

## 2018-06-07 DIAGNOSIS — M79641 Pain in right hand: Secondary | ICD-10-CM | POA: Diagnosis not present

## 2018-06-07 DIAGNOSIS — M25631 Stiffness of right wrist, not elsewhere classified: Secondary | ICD-10-CM

## 2018-06-07 DIAGNOSIS — M25641 Stiffness of right hand, not elsewhere classified: Secondary | ICD-10-CM

## 2018-06-07 DIAGNOSIS — M6281 Muscle weakness (generalized): Secondary | ICD-10-CM

## 2018-06-07 DIAGNOSIS — R208 Other disturbances of skin sensation: Secondary | ICD-10-CM

## 2018-06-07 NOTE — Therapy (Signed)
[employer] [facility] PHYS[facility]ORTS MEDICINE [address], [place], [address] Phone: [phone]   Fax:  [phone]  Occupational Therapy Treatment  Patient Details  Name: John James MRN: [id number] Date of Birth: [birth year] No data recorded  Encounter Date: 06/07/2018  OT End of Session - 06/07/18 1213    Visit Number  4    Number of Visits  14    Date for OT Re-Evaluation  07/04/18    OT Start Time  1100    OT Stop Time  1156    OT Time Calculation (min)  56 min    Activity Tolerance  Patient tolerated treatment well    Behavior During Therapy  [facility] for tasks assessed/performed       Past Medical History:  Diagnosis Date  . Arthritis   . Back pain   . Erectile dysfunction   . GERD (gastroesophageal reflux disease)   . Heart murmur    as a child  . History of MRSA infection 2016   left forearm from spider bite  . Hypercholesteremia   . Hypertension   . Sleep apnea     Past Surgical History:  Procedure Laterality Date  . CARPOMETACARPAL (CMC) FUSION OF THUMB Right 04/07/2018   Procedure: CARPOMETACARPAL (CMC) ARHTROPLASTY OF THUMB;  Surgeon: Kennedy Bucker, MD;  Location: [facility];  Service: Orthopedics;  Laterality: Right;  . CERVICAL FUSION  11/2016  . ELBOW SURGERY Left   . FRACTURE SURGERY    . KNEE ARTHROSCOPY WITH MEDIAL MENISECTOMY Left 01/13/2018   Procedure: KNEE ARTHROSCOPY WITH MEDIAL MENISECTOMY;  Surgeon: Kennedy Bucker, MD;  Location: [facility];  Service: Orthopedics;  Laterality: Left;  . LAPAROSCOPIC GASTRIC BYPASS    . SHOULDER SURGERY Left   . TONSILLECTOMY      There were no vitals filed for this visit.  Subjective Assessment - 06/07/18 1205    Subjective   The 1 lbs weight is so easy , and then my thumb base feels so swollen -and tender spot when I push right her on fleshy part at thumb , thumb not out to side yet     Patient Stated Goals  Want to be able to use my R hand and thumb - I like still to  shoot at shooting range, some yardwork , working out at the gym     Currently in Pain?  Yes    Pain Score  3     Pain Location  Hand    Pain Orientation  Right    Pain Descriptors / Indicators  Aching;Tender    Pain Type  Surgical pain    Pain Onset  More than a month ago    Pain Frequency  Intermittent         [facility] OT Assessment - 06/07/18 0001      AROM   Right Wrist Extension  60 Degrees    Right Wrist Flexion  84 Degrees      Strength   Right Hand Grip (lbs)  54   2nd time 75   Right Hand Lateral Pinch  5 lbs   11 support at prox phalanges   Right Hand 3 Point Pinch  11 lbs    Left Hand Grip (lbs)  105    Left Hand Lateral Pinch  25 lbs    Left Hand 3 Point Pinch  22 lbs      Right Hand AROM   R Thumb MCP 0-60  45 Degrees  R Thumb IP 0-80  80 Degrees    R Thumb Radial ABduction/ADduction 0-55  55    R Thumb Palmar ABduction/ADduction 0-45  55    R Thumb Opposition to Index  --   Opposition to base of 5th - edema proximal       Pt was not seen for 2 wks- assess AROM and grip/prehension strength   see flowsheet  Some stiffness and pain at wrist per pt  And in UD PROM end range  Tender point at thenar eminence  Show some increase hyper extention of thumb during RA  Pt reed again not to hyper extend MP during PA and RA         OT Treatments/Exercises (OP) - 06/07/18 0001      RUE Fluidotherapy   Number Minutes Fluidotherapy  8 Minutes    RUE Fluidotherapy Location  Hand;Wrist    Comments  AROM for wrist and thumb in all planes      soft tissue mobs to webspace - and joint mobs and gentle traction to wrist - mostly ulnar side  AAROM for wrist flexion ,ext and RD, UD by OT    Pt to Cont with AROM tendon glides and thumb opposition after contrast  Some edema at base of thumb still - hindering composite flexion  And thumb PA and RA AROM   Hold off on PROM for wrist  At home   Done and add to HEP  2 lbs for wrist in all planes - 12 reps  2 x day  pain free  Light blue putty for grip(12 reps)  , lat  ( short sausage 2 x thru)and 3 point grip( long sausage thru 2 x alternate 2/3 and 4/5th digits)   Pain free  2 x day  Ice as needed  Fitted pt and to use CMC neoprene splint at home  in more activities that cause pain  And try sleep with it Hard splint only for heavy act      OT Education - 06/07/18 1212    Education Details  progress , protocol and upgrade strengthening    Person(s) Educated  Patient    Methods  Explanation;Demonstration;Verbal cues;Handout    Comprehension  Verbalized understanding;Returned demonstration       OT Short Term Goals - 05/09/18 1748      OT SHORT TERM GOAL #1   Title  Pt to be ind in perfroming and progressing in AROM for thumb and wrist to [facility] with pain less than 1-2/10     Baseline  no knowledge of HEP- pain 3-8/10 - in splint     Time  3    Period  Weeks    Status  New    Target Date  05/30/18      OT SHORT TERM GOAL #2   Title  Pain on PRWHE improve with more than 20 points     Baseline  pain at eval on PRWHE 37/50    Time  3    Period  Weeks    Status  New    Target Date  05/30/18        OT Long Term Goals - 05/09/18 1749      OT LONG TERM GOAL #1   Title  Pt able to participate in strengthening HEP for thumb and wrist to be able to wean out of thumb spica to wear less than 25 % of time     Baseline  no strenghtening yet - splint  on more than 75% of time and sleeping     Time  4    Period  Weeks    Status  New    Target Date  06/06/18      OT LONG TERM GOAL #2   Title  Strength in R  thumb PA , RA and wrist in all planes 5/5 without pain to increase use on function scale on PRWHE by more than 15 points     Baseline  no strength yet- AROM decreased- function score on PRWHE 23.5/50 at eval     Time  8    Period  Weeks    Status  New    Target Date  07/04/18      OT LONG TERM GOAL #3   Title  Grip strengthn and prehension improve with more than 50% compare to L hand  for pt to return to shooting     Baseline  NT- pt only  4 1/2 wks s/p     Time  8    Period  Weeks    Status  New    Target Date  07/04/18            Plan - 06/07/18 1215    Clinical Impression Statement  Pt is 8 wks s/p R CMC arthroplasty - pt making progress in AROM - pt was not seen for 2 wks -pt return this week to therapy - upgrade HEP for strengthening to thumb and wrist - pt to keep it pain free and  can wean out of thumb spica and more into CMC neoprene splint - tender point on volar thenar eminence - and wrist stiffness -     OT Occupational Profile and History  Problem Focused Assessment - Including review of records relating to presenting problem    Occupational performance deficits (Please refer to evaluation for details):  ADL's;IADL's;Leisure;Social Participation;Play    Body Structure / Function / Physical Skills  ADL;Flexibility;ROM;Strength;Coordination;Edema;Scar mobility;Pain;Sensation;UE functional use;Decreased knowledge of precautions;FMC    Rehab Potential  Good    Clinical Decision Making  Several treatment options, min-mod task modification necessary    OT Frequency  --   1-2 x wks   OT Duration  6 weeks    OT Treatment/Interventions  Self-care/ADL training;Paraffin;Therapeutic exercise;Splinting;Scar mobilization;Manual Therapy;Fluidtherapy;Contrast Bath;Passive range of motion;Patient/family education    Plan  assess progress with HEP     OT Home Exercise Plan  see pt instruction     Consulted and Agree with Plan of Care  Patient       Patient will benefit from skilled therapeutic intervention in order to improve the following deficits and impairments:  Body Structure / Function / Physical Skills  Visit Diagnosis: Pain in right hand  Stiffness of right hand, not elsewhere classified  Stiffness of right wrist, not elsewhere classified  Muscle weakness (generalized)  Other disturbances of skin sensation    Problem List There are no active  problems to display for this patient.   Oletta Cohn OTR/L,CLT 06/07/2018, 12:18 PM  [place] [facility] PHYSICAL AND SPORTS MEDICINE [address] [place], [address] Phone: [phone]   Fax:  [phone]  Name: John James MRN: [id number] Date of Birth: [birth year]

## 2018-06-07 NOTE — Patient Instructions (Signed)
Cont with AROM tendon glides and thumb opposition after contrast  And thumb PA and RA AROM   Hold off on PROM for wrist  And upgrade to 2 lbs for wrist in all planes - 12 reps  2 x day pain free  Light blue putty for grip(12 reps)  , lat  ( short sausage 2 x thru)and 3 point grip( long sausage thru 2 x alternate 2/3 and 4/5th digits)   Pain free  2 x day  Ice as needed  And use CMC neoprene splint in more activities that cause pain  And try sleep with it Hard splint only for heavy act

## 2018-06-09 ENCOUNTER — Ambulatory Visit: Payer: 59 | Attending: Orthopedic Surgery | Admitting: Occupational Therapy

## 2018-06-09 ENCOUNTER — Other Ambulatory Visit: Payer: Self-pay

## 2018-06-09 DIAGNOSIS — M25641 Stiffness of right hand, not elsewhere classified: Secondary | ICD-10-CM | POA: Diagnosis present

## 2018-06-09 DIAGNOSIS — M6281 Muscle weakness (generalized): Secondary | ICD-10-CM

## 2018-06-09 DIAGNOSIS — R208 Other disturbances of skin sensation: Secondary | ICD-10-CM | POA: Diagnosis present

## 2018-06-09 DIAGNOSIS — M25631 Stiffness of right wrist, not elsewhere classified: Secondary | ICD-10-CM

## 2018-06-09 DIAGNOSIS — M79641 Pain in right hand: Secondary | ICD-10-CM | POA: Diagnosis present

## 2018-06-09 NOTE — Therapy (Signed)
[place] [facility] PHYSICAL AND SPORTS MEDICINE [address], [place], [address] Phone: [phone]   Fax:  [phone]  Occupational Therapy Treatment  Patient Details  Name: John James MRN: [id number] Date of Birth: 1970/11/16 No data recorded  Encounter Date: 06/09/2018  OT End of Session - 06/09/18 1205    Visit Number  5    Number of Visits  14    Date for OT Re-Evaluation  07/04/18    OT Start Time  1107    OT Stop Time  1202    OT Time Calculation (min)  55 min    Activity Tolerance  Patient tolerated treatment well    Behavior During Therapy  [facility] for tasks assessed/performed       Past Medical History:  Diagnosis Date  . Arthritis   . Back pain   . Erectile dysfunction   . GERD (gastroesophageal reflux disease)   . Heart murmur    as a child  . History of MRSA infection 2016   left forearm from spider bite  . Hypercholesteremia   . Hypertension   . Sleep apnea     Past Surgical History:  Procedure Laterality Date  . CARPOMETACARPAL (CMC) FUSION OF THUMB Right 04/07/2018   Procedure: CARPOMETACARPAL (CMC) ARHTROPLASTY OF THUMB;  Surgeon: Kennedy Bucker, MD;  Location: [facility] ORS;  Service: Orthopedics;  Laterality: Right;  . CERVICAL FUSION  11/2016  . ELBOW SURGERY Left   . FRACTURE SURGERY    . KNEE ARTHROSCOPY WITH MEDIAL MENISECTOMY Left 01/13/2018   Procedure: KNEE ARTHROSCOPY WITH MEDIAL MENISECTOMY;  Surgeon: Kennedy Bucker, MD;  Location: [facility];  Service: Orthopedics;  Laterality: Left;  . LAPAROSCOPIC GASTRIC BYPASS    . SHOULDER SURGERY Left   . TONSILLECTOMY      There were no vitals filed for this visit.  Subjective Assessment - 06/09/18 1203    Subjective   Doing okay- feel tired after doing the new exercises - putty and weight - little sore - but the soft splint feels much better - wearing it about 50% of time     Patient Stated Goals  Want to be able to use my R hand and thumb - I like still to  shoot at shooting range, some yardwork , working out at the gym     Currently in Pain?  Yes    Pain Score  2     Pain Location  Hand    Pain Orientation  Right    Pain Descriptors / Indicators  Tender    Pain Type  Surgical pain    Pain Onset  More than a month ago    Pain Frequency  Intermittent                   OT Treatments/Exercises (OP) - 06/09/18 0001      RUE Fluidotherapy   Number Minutes Fluidotherapy  8 Minutes    RUE Fluidotherapy Location  Hand;Wrist    Comments  AROM for thumb and wrist in all planes at [facility]        Scar massage done - use vibration and then xtractor on both scars with AROM of thumb and digits   scar mobs by OT - and pt to cont with cica scar pad    soft tissue mobs to webspace - and joint mobs and gentle traction to wrist - mostly ulnar side   AAROM for wrist flexion ,ext and RD, UD by OT  Pt to Cont with AROM tendon glides and thumb opposition after contrast at home  Some edema at base of thumb still - hindering composite flexion was 50 this date - better than last time - cmc neoprene appear providing some compression to pt  And thumb PA and RA AROM   Hold off on PROM for wrist  At home   Review   2 lbs for wrist in all planes - 12 reps  - 2 sets - alternate exercises  Can increase to 3 sets in 3 days  2 x day pain free   Light blue putty for grip(12 reps)  , lat  12 reps and 3 point grip( short sausage 12 reps alternate 2/3 and 4/5th digits)   But wear cmc neoprene splint for last set - increase to 3 sets in 3 days - and alternate exercises Pain free  2 x day  Ice as needed  Cont to  use [facility] neoprene splint at home  in more activities that cause pain  And try sleep with it Hard splint only for heavy act     OT Education - 06/09/18 1205    Education Details  progress , protocol and upgrade strengthening    Person(s) Educated  Patient    Methods  Explanation;Demonstration;Verbal cues;Handout    Comprehension  Verbalized  understanding;Returned demonstration       OT Short Term Goals - 05/09/18 1748      OT SHORT TERM GOAL #1   Title  Pt to be ind in perfroming and progressing in AROM for thumb and wrist to [facility] with pain less than 1-2/10     Baseline  no knowledge of HEP- pain 3-8/10 - in splint     Time  3    Period  Weeks    Status  New    Target Date  05/30/18      OT SHORT TERM GOAL #2   Title  Pain on PRWHE improve with more than 20 points     Baseline  pain at eval on PRWHE 37/50    Time  3    Period  Weeks    Status  New    Target Date  05/30/18        OT Long Term Goals - 05/09/18 1749      OT LONG TERM GOAL #1   Title  Pt able to participate in strengthening HEP for thumb and wrist to be able to wean out of thumb spica to wear less than 25 % of time     Baseline  no strenghtening yet - splint on more than 75% of time and sleeping     Time  4    Period  Weeks    Status  New    Target Date  06/06/18      OT LONG TERM GOAL #2   Title  Strength in R  thumb PA , RA and wrist in all planes 5/5 without pain to increase use on function scale on PRWHE by more than 15 points     Baseline  no strength yet- AROM decreased- function score on PRWHE 23.5/50 at eval     Time  8    Period  Weeks    Status  New    Target Date  07/04/18      OT LONG TERM GOAL #3   Title  Grip strengthn and prehension improve with more than 50% compare to L hand for pt to return to  shooting     Baseline  NT- pt only  4 1/2 wks s/p     Time  8    Period  Weeks    Status  New    Target Date  07/04/18            Plan - 06/09/18 1206    Clinical Impression Statement  Pt is 8 1/2 wks s/p R CMC arthroplasty - pt making progress in strength and able to upgrade to 2 sets for putty and 2 lbs weight- to use CMC neoprene splint for last set of 3 point grip to support CMC - and using it about 50% of time during day and night time     OT Occupational Profile and History  Problem Focused Assessment - Including  review of records relating to presenting problem    Occupational performance deficits (Please refer to evaluation for details):  ADL's;IADL's;Leisure;Social Participation;Play    Body Structure / Function / Physical Skills  ADL;Flexibility;ROM;Strength;Coordination;Edema;Scar mobility;Pain;Sensation;UE functional use;Decreased knowledge of precautions;FMC    Rehab Potential  Good    Clinical Decision Making  Several treatment options, min-mod task modification necessary    OT Frequency  --   1-2 x wk   OT Duration  6 weeks    OT Treatment/Interventions  Self-care/ADL training;Paraffin;Therapeutic exercise;Splinting;Scar mobilization;Manual Therapy;Fluidtherapy;Contrast Bath;Passive range of motion;Patient/family education    Plan  assess progress with HEP and upgrade as tolerated     OT Home Exercise Plan  see pt instruction     Consulted and Agree with Plan of Care  Patient       Patient will benefit from skilled therapeutic intervention in order to improve the following deficits and impairments:  Body Structure / Function / Physical Skills  Visit Diagnosis: Pain in right hand  Stiffness of right hand, not elsewhere classified  Stiffness of right wrist, not elsewhere classified  Muscle weakness (generalized)  Other disturbances of skin sensation    Problem List There are no active problems to display for this patient.   Oletta Cohn OTR/L,CLT 06/09/2018, 12:09 PM  [place] [facility] PHYSICAL AND SPORTS MEDICINE [address] [place], [address] Phone: [phone]   Fax:  [phone]  Name: John James MRN: [id number] Date of Birth: September 25, 1970

## 2018-06-09 NOTE — Patient Instructions (Signed)
Increase putty and 2 lbs weight to 2 sets and then in 3 days to 3 sets  Can use CMC neoprene splint for last set for 3 point grip

## 2018-06-14 ENCOUNTER — Ambulatory Visit: Payer: 59 | Admitting: Occupational Therapy

## 2018-06-14 ENCOUNTER — Other Ambulatory Visit: Payer: Self-pay

## 2018-06-14 DIAGNOSIS — M79641 Pain in right hand: Secondary | ICD-10-CM

## 2018-06-14 DIAGNOSIS — R208 Other disturbances of skin sensation: Secondary | ICD-10-CM

## 2018-06-14 DIAGNOSIS — M25631 Stiffness of right wrist, not elsewhere classified: Secondary | ICD-10-CM

## 2018-06-14 DIAGNOSIS — M25641 Stiffness of right hand, not elsewhere classified: Secondary | ICD-10-CM

## 2018-06-14 DIAGNOSIS — M6281 Muscle weakness (generalized): Secondary | ICD-10-CM

## 2018-06-14 NOTE — Therapy (Signed)
[place] [facility] PHYS[facility]ORTS MEDICINE [address] [place], [address] Phone: [phone]   Fax:  [phone]  Occupational Therapy Treatment  Patient Details  Name: John James MRN: [id number] Date of Birth: November 19, 1970 No data recorded  Encounter Date: 06/14/2018  OT End of Session - 06/14/18 1118    Visit Number  6    Number of Visits  14    Date for OT Re-Evaluation  07/04/18    OT Start Time  1100    OT Stop Time  1145    OT Time Calculation (min)  45 min    Activity Tolerance  Patient tolerated treatment well    Behavior During Therapy  [facility] for tasks assessed/performed       Past Medical History:  Diagnosis Date  . Arthritis   . Back pain   . Erectile dysfunction   . GERD (gastroesophageal reflux disease)   . Heart murmur    as a child  . History of MRSA infection 2016   left forearm from spider bite  . Hypercholesteremia   . Hypertension   . Sleep apnea     Past Surgical History:  Procedure Laterality Date  . CARPOMETACARPAL (CMC) FUSION OF THUMB Right 04/07/2018   Procedure: CARPOMETACARPAL (CMC) ARHTROPLASTY OF THUMB;  Surgeon: Kennedy Bucker, MD;  Location: [facility];  Service: Orthopedics;  Laterality: Right;  . CERVICAL FUSION  11/2016  . ELBOW SURGERY Left   . FRACTURE SURGERY    . KNEE ARTHROSCOPY WITH MEDIAL MENISECTOMY Left 01/13/2018   Procedure: KNEE ARTHROSCOPY WITH MEDIAL MENISECTOMY;  Surgeon: Kennedy Bucker, MD;  Location: [facility];  Service: Orthopedics;  Laterality: Left;  . LAPAROSCOPIC GASTRIC BYPASS    . SHOULDER SURGERY Left   . TONSILLECTOMY      There were no vitals filed for this visit.  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  No/denies         [facility] OT Assessment - 06/14/18 0001      AROM   Right Wrist Extension  65 Degrees    Right Wrist Flexion  84 Degrees      Strength   Right Hand Grip (lbs)  86    Right Hand Lateral Pinch  10 lbs    Right Hand 3 Point Pinch  11 lbs    Left Hand Grip (lbs)  105    Left Hand Lateral Pinch  25 lbs    Left Hand 3 Point Pinch  22 lbs               OT Treatments/Exercises (OP) - 06/14/18 0001      RUE Paraffin   Number Minutes Paraffin  --   10   RUE Paraffin Location  Wrist    Comments  Flexion, ext stretch - heat         Scar massage done - use vibration and then xtractor on both scars with AROM of thumb and digits   scar mobs by OT - and pt to cont  with cica scar pad    soft tissue mobs to webspace - and joint mobs and gentle traction to wrist - mostly ulnar side        Upgrade to 2 rubber bands for PA and RA of thumb  12 reps   2 x day  And upgrade to teal med putty  Grip. Lat and 3 point grip - 12 reps  2 x day pain free  review with pt and  To cont with 2 lbs weight for wrist after AAROM and PROM - 12 reps  3 sets  Pain free  Cont to use CMC neoprene splintat homein more activities that cause pain - pt  wearing it about 25 %  And try sleep with it   OT Education - 06/14/18 1602    Education Details  progress , protocol and upgrade strengthening    Person(s) Educated  Patient    Methods  Explanation;Demonstration;Verbal cues;Handout    Comprehension  Verbalized understanding;Returned demonstration       OT Short Term Goals - 05/09/18 1748      OT SHORT TERM GOAL #1   Title  Pt to be ind in perfroming and progressing [facility] less than 1-2/10     Baseline  no knowledge of HEP- pain 3-8/10 - in splint     Time  3    Period  Weeks    Status  New    Target Date  05/30/18      OT SHORT TERM GOAL #2   Title  Pain on PRWHE  improve with more than 20 points     Baseline  pain at eval on PRWHE 37/50    Time  3    Period  Weeks    Status  New    Target Date  05/30/18        OT Long Term Goals - 05/09/18 1749      OT LONG TERM GOAL #1   Title  Pt able to participate in strengthening HEP for thumb and wrist to be able to wean out of thumb spica to wear less than 25 % of time     Baseline  no strenghtening yet - splint on more than 75% of time and sleeping     Time  4    Period  Weeks    Status  New    Target Date  06/06/18      OT LONG TERM GOAL #2   Title  Strength in R  thumb PA , RA and wrist in all planes 5/5 without pain to increase use on function scale on PRWHE by more than 15 points     Baseline  no strength yet- AROM decreased- function score on PRWHE 23.5/50 at eval     Time  8    Period  Weeks    Status  New    Target Date  07/04/18      OT LONG TERM GOAL #3   Title  Grip strengthn and prehension improve with more than 50% compare to L hand for pt to return to shooting     Baseline  NT- pt only  4 1/2 wks s/p     Time  8    Period  Weeks    Status  New    Target Date  07/04/18            Plan - 06/14/18 1118    Clinical Impression  Statement  Pt is 8 1/2 wks s/p R CMC arthroplasty - pt making progress in strength and able to upgrade to teal med putty - but decrease sets and cont 2 lbs weight - will upgrade that next session if can tolerate     Occupational performance deficits (Please refer to evaluation for details):  ADL's;IADL's;Leisure;Social Participation;Play    Body Structure / Function / Physical Skills  ADL;Flexibility;ROM;Strength;Coordination;Edema;Scar mobility;Pain;Sensation;UE functional use;Decreased knowledge of precautions;FMC;Cardiopulmonary status limiting activity    Rehab Potential  Good    Clinical Decision Making  Several treatment options, min-mod task modification necessary    OT Frequency  --   1-2x wk   OT Duration  6 weeks    OT Treatment/Interventions   Self-care/ADL training;Paraffin;Therapeutic exercise;Splinting;Scar mobilization;Manual Therapy;Fluidtherapy;Contrast Bath;Passive range of motion;Patient/family education    Plan  assess progress with HEP and upgrade as tolerated     OT Home Exercise Plan  see pt instruction     Consulted and Agree with Plan of Care  Patient       Patient will benefit from skilled therapeutic intervention in order to improve the following deficits and impairments:  Body Structure / Function / Physical Skills  Visit Diagnosis: Pain in right hand  Stiffness of right hand, not elsewhere classified  Stiffness of right wrist, not elsewhere classified  Muscle weakness (generalized)  Other disturbances of skin sensation    Problem List There are no active problems to display for this patient.   Oletta Cohn OTR/L,CLT 06/14/2018, 4:07 PM  [place] [facility] PHYSICAL AND SPORTS MEDICINE [address] [place], [address] Phone: [phone]   Fax:  [phone]  Name: John James MRN: [id number] Date of Birth: 1970/10/18

## 2018-06-14 NOTE — Patient Instructions (Signed)
Upgrade to 2 rubber bands for PA and RA of thumb  12 reps   2 x day  And upgrade to teal med putty  Grip. Lat and 3 point grip - 12 reps  2 x day pain free

## 2018-06-16 ENCOUNTER — Other Ambulatory Visit: Payer: Self-pay

## 2018-06-16 ENCOUNTER — Ambulatory Visit: Payer: 59 | Admitting: Occupational Therapy

## 2018-06-16 DIAGNOSIS — R208 Other disturbances of skin sensation: Secondary | ICD-10-CM

## 2018-06-16 DIAGNOSIS — M79641 Pain in right hand: Secondary | ICD-10-CM | POA: Diagnosis not present

## 2018-06-16 DIAGNOSIS — M6281 Muscle weakness (generalized): Secondary | ICD-10-CM

## 2018-06-16 DIAGNOSIS — M25641 Stiffness of right hand, not elsewhere classified: Secondary | ICD-10-CM

## 2018-06-16 DIAGNOSIS — M25631 Stiffness of right wrist, not elsewhere classified: Secondary | ICD-10-CM

## 2018-06-16 NOTE — Therapy (Signed)
[place] [facility] PHYS[facility] SPORTS MEDICINE [address], [place], [address] Phone: [phone]   Fax:  [phone]  Occupational Therapy Treatment  Patient Details  Name: John James MRN: [id number] Date of Birth: 11/06/1970 No data recorded  Encounter Date: 06/16/2018  OT End of Session - 06/16/18 1119    Visit Number  7    Number of Visits  14    Date for OT Re-Evaluation  07/04/18    OT Start Time  1105    OT Stop Time  1150    OT Time Calculation (min)  45 min    Activity Tolerance  Patient tolerated treatment well    Behavior During Therapy  [facility] for tasks assessed/performed       Past Medical History:  Diagnosis Date  . Arthritis   . Back pain   . Erectile dysfunction   . GERD (gastroesophageal reflux disease)   . Heart murmur    as a child  . History of MRSA infection 2016   left forearm from spider bite  . Hypercholesteremia   . Hypertension   . Sleep apnea     Past Surgical History:  Procedure Laterality Date  . CARPOMETACARPAL (CMC) FUSION OF THUMB Right 04/07/2018   Procedure: CARPOMETACARPAL (CMC) ARHTROPLASTY OF THUMB;  Surgeon: Kennedy Bucker, MD;  Location: [facility];  Service: Orthopedics;  Laterality: Right;  . CERVICAL FUSION  11/2016  . ELBOW SURGERY Left   . FRACTURE SURGERY    . KNEE ARTHROSCOPY WITH MEDIAL MENISECTOMY Left 01/13/2018   Procedure: KNEE ARTHROSCOPY WITH MEDIAL MENISECTOMY;  Surgeon: Kennedy Bucker, MD;  Location: [facility];  Service: Orthopedics;  Laterality: Left;  . LAPAROSCOPIC GASTRIC BYPASS    . SHOULDER SURGERY Left   . TONSILLECTOMY      There were no vitals filed for this visit.  Subjective Assessment - 06/16/18 1114    Subjective   My wrist is stiff this am but I did pressure wash my house - I could feel it yesterday - doing a lot of plants     Patient Stated Goals  Want to be able to use my R hand and thumb - I like still to shoot at shooting range, some yardwork , working  out at the gym     Currently in Pain?  No/denies                   OT Treatments/Exercises (OP) - 06/16/18 0001      RUE Paraffin   Number Minutes Paraffin  8 Minutes    RUE Paraffin Location  Wrist   hand    Comments  gentle flexion and ext splint        Scar massage done - use vibration and then xtractor on thumb scar with AROM of thumb  scar mobs by OT - and pt to cont with cica scar pad  And kinesiotape   soft tissue mobs to webspace - and joint mobs and gentle traction to wrist - mostly ulnar side  Done this date BTE setting of CPM for flexion and extention each 200 sec to increase ROM and decrease stiffness    Increase teal putty HEP for grip , lat and 3 point grip  2 sets of 12  PA and RA of thumb 2 x 12 with 2 rubber bands 2 x day  Increase to 3 sets on SUNDAy  Cont with 2 lbs for wrist flexion , ext But increase to  This  date to 3 lbs for RD, UD, sup /pro - 12 reps - no issues   2 sets  Pain free  Ice as needed and neoprene splint as needed     OT Education - 06/16/18 1442    Education Details  upgrade HEP     Person(s) Educated  Patient    Methods  Explanation;Demonstration;Verbal cues;Handout    Comprehension  Verbalized understanding;Returned demonstration       OT Short Term Goals - 05/09/18 1748      OT SHORT TERM GOAL #1   Title  Pt to be ind in perfroming and progressing [facility]OM [facility] less than 1-2/10     Baseline  no knowledge of HEP- pain 3-8/10 - in splint     Time  3    Period  Weeks    Status  New    Target Date  05/30/18      OT SHORT TERM GOAL #2   Title  Pain on PRWHE improve with more than 20 points     Baseline  pain at eval on PRWHE 37/50    Time  3    Period  Weeks    Status  New    Target Date  05/30/18        OT Long Term Goals - 05/09/18 1749      OT LONG TERM GOAL #1   Title  Pt able to participate in strengthening HEP for thumb and wrist to be able to wean out of thumb  spica to wear less than 25 % of time     Baseline  no strenghtening yet - splint on more than 75% of time and sleeping     Time  4    Period  Weeks    Status  New    Target Date  06/06/18      OT LONG TERM GOAL #2   Title  Strength in R  thumb PA , RA and wrist in all planes 5/5 without pain to increase use on function scale on PRWHE by more than 15 points     Baseline  no strength yet- AROM decreased- function score on PRWHE 23.5/50 at eval     Time  8    Period  Weeks    Status  New    Target Date  07/04/18      OT LONG TERM GOAL #3   Title  Grip strengthn and prehension improve with more than 50% compare to L hand for pt to return to shooting     Baseline  NT- pt only  4 1/2 wks s/p     Time  8    Period  Weeks    Status  New    Target Date  07/04/18            Plan - 06/16/18 1119    Clinical Impression Statement  Pt is 10 wks s/p R CMC arthroplasty - pt cont to have tender spot at thumb , and distal part of scar adhere still but improving - pt show great progress in AROM , strength at wrist more than thumb - but improving -and increase functional use per pt     OT Occupational Profile and History  Problem Focused Assessment - Including review of records relating to presenting problem    Occupational performance deficits (Please refer to evaluation for details):  ADL's;IADL's;Leisure;Social Participation;Play    Body Structure / Function / Physical  Skills  Flexibility;ROM;Strength;Coordination;Edema;Scar mobility;Pain;Sensation;UE functional use;Decreased knowledge of precautions;FMC;Cardiopulmonary status limiting activity;ADL    Clinical Decision Making  Several treatment options, min-mod task modification necessary    OT Frequency  --   1-2 wk   OT Duration  2 weeks    OT Treatment/Interventions  Self-care/ADL training;Paraffin;Therapeutic exercise;Splinting;Scar mobilization;Manual Therapy;Fluidtherapy;Contrast Bath;Passive range of motion;Patient/family education     Plan  assess progress with HEP and upgrade as tolerated     OT Home Exercise Plan  see pt instruction     Consulted and Agree with Plan of Care  Patient       Patient will benefit from skilled therapeutic intervention in order to improve the following deficits and impairments:  Body Structure / Function / Physical Skills  Visit Diagnosis: Pain in right hand  Stiffness of right hand, not elsewhere classified  Stiffness of right wrist, not elsewhere classified  Muscle weakness (generalized)  Other disturbances of skin sensation    Problem List There are no active problems to display for this patient.   Oletta Cohn OTR/L,CLT 06/16/2018, 2:46 PM  [place] [facility] PHYSICAL AND SPORTS MEDICINE [address], [place], [address] Phone: [phone]   Fax:  [phone]  Name: John James MRN: [id number] Date of Birth: 1970-06-17

## 2018-06-16 NOTE — Patient Instructions (Signed)
Increase teal putty , lat and 3 point grip  2 sets of 12  PA and RA of thumb 2 x 12 for 2 rubber bands 2 x day  Increase to 3 sets on SUNDAy Cont with 2 lbs for wrist flexion , ext But increase to 3 lbs for RD, UD, sup /pro - 12 reps   2 sets  Pain free

## 2018-06-21 ENCOUNTER — Other Ambulatory Visit: Payer: Self-pay

## 2018-06-21 ENCOUNTER — Encounter: Payer: Self-pay | Admitting: Orthopedic Surgery

## 2018-06-21 ENCOUNTER — Ambulatory Visit: Payer: 59 | Admitting: Occupational Therapy

## 2018-06-21 DIAGNOSIS — M79641 Pain in right hand: Secondary | ICD-10-CM

## 2018-06-21 DIAGNOSIS — R208 Other disturbances of skin sensation: Secondary | ICD-10-CM

## 2018-06-21 DIAGNOSIS — M25631 Stiffness of right wrist, not elsewhere classified: Secondary | ICD-10-CM

## 2018-06-21 DIAGNOSIS — M6281 Muscle weakness (generalized): Secondary | ICD-10-CM

## 2018-06-21 DIAGNOSIS — M25641 Stiffness of right hand, not elsewhere classified: Secondary | ICD-10-CM

## 2018-06-21 NOTE — Therapy (Signed)
[place] [facility] [address], [place], [address] Phone: [phone]   Fax:  [phone]  Occupational Therapy Treatment  Patient Details  Name: John James MRN: [id number] Date of Birth: January 02, 1971 No data recorded  Encounter Date: 06/21/2018  OT End of Session - 06/21/18 1530    Visit Number  8    Number of Visits  14    Date for OT Re-Evaluation  07/04/18    OT Start Time  1110    OT Stop Time  1155    OT Time Calculation (min)  45 min    Activity Tolerance  Patient tolerated treatment well    Behavior During Therapy  [facility] for tasks assessed/performed       Past Medical History:  Diagnosis Date  . Arthritis   . Back pain   . Erectile dysfunction   . GERD (gastroesophageal reflux disease)   . Heart murmur    as a child  . History of MRSA infection 2016   left forearm from spider bite  . Hypercholesteremia   . Hypertension   . Sleep apnea     Past Surgical History:  Procedure Laterality Date  . CARPOMETACARPAL (CMC) FUSION OF THUMB Right 04/07/2018   Procedure: CARPOMETACARPAL (CMC) ARHTROPLASTY OF THUMB;  Surgeon: Kennedy Bucker, MD;  Location: [facility] ORS;  Service: Orthopedics;  Laterality: Right;  . CERVICAL FUSION  11/2016  . ELBOW SURGERY Left   . FRACTURE SURGERY    . KNEE ARTHROSCOPY WITH MEDIAL MENISECTOMY Left 01/13/2018   Procedure: KNEE ARTHROSCOPY WITH MEDIAL MENISECTOMY;  Surgeon: Kennedy Bucker, MD;  Location: [facility] ORS;  Service: Orthopedics;  Laterality: Left;  . LAPAROSCOPIC GASTRIC BYPASS    . SHOULDER SURGERY Left   . TONSILLECTOMY      There were no vitals filed for this visit.  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  Yes    Pain Score  2     Pain Location  Wrist    Pain Orientation  Right    Pain Descriptors / Indicators  Aching    Pain Type  Surgical pain    Pain Onset  More than a month ago         [facility] OT Assessment - 06/21/18 0001      Strength   Right Hand Grip (lbs)  63   86 wiht CMC neoprene splint    Right Hand Lateral Pinch  10 lbs    Right Hand 3 Point Pinch  10 lbs    Left Hand Grip (lbs)  105    Left Hand Lateral Pinch  25 lbs    Left Hand 3 Point Pinch  22 lbs     Assess light wall pushups - strain with some discomfort over dorsal ulnar side of wrist- tender - appear ECU    Scar massage done - use vibration on thumb scar with AROM of thumb  scar mobs by OT - and pt to cont  with cica scar pad  or  kinesiotape   soft tissue mobs to webspace - and joint mobs and gentle traction to wrist - joint mobs - very little tightness   but did this date Graston tool nr 2 over dorsal , ulnar side of wrist - sweeping - some tightness over extensors        Pt to hold off on 2 and 3 lbs for wrist HEP  Do wrist flexion , extention stretches   And increase this date to  medium green putty - for gripping 12 reps  3 point grip - 2 long snakes - with CMC neoprene on for correct mechanism for thumb  And 12 reps with putty in sausage - with CMC neoprene splint  2 x day  Assess next visit - will probable do green with out CMC neoprene    OT Treatments/Exercises (OP) - 06/21/18 0001      RUE Paraffin   Number Minutes Paraffin  8 Minutes    RUE Paraffin Location  Wrist    Comments  Flexion and extention stretches during paraffin for 2 min alterrnate              OT Education - 06/21/18 1529    Education Details  Pt c/o some pain over dorsal ulnar side of wrist - pt to hold off on weight for wrist -and only do stretches for wrist and upgrade putty for grip / prehension      Person(s) Educated  Patient    Methods  Explanation;Demonstration;Verbal cues;Handout    Comprehension  Verbalized understanding;Returned demonstration       OT Short Term Goals - 06/21/18 1533      OT SHORT TERM GOAL #1   Title  Pt to be ind in perfroming and progressing in AROM for thumb and wrist to [facility] with pain less than 1-2/10     Status  Achieved      OT SHORT TERM GOAL #2   Title  Pain on PRWHE improve with more than 20 points     Baseline  pain at eval on PRWHE 37/50 - assess next time - pain  at dorsal ulnar wrist more than thumb     Time  2    Period  Weeks    Status  On-going    Target Date  07/05/18        OT Long Term Goals - 06/21/18 1535      OT LONG TERM GOAL #1   Title  Pt able to participate in strengthening HEP for thumb and wrist to be able to wean out of thumb spica to wear less than 25 % of time     Status  Achieved      OT LONG TERM GOAL #2   Title  Strength in R  thumb PA , RA and wrist in all planes 5/5 without pain to increase use on function scale on PRWHE by more than 15 points     Baseline  strain with thumb PA and RA strengthenign - but ROM WNL - strength 4+/5 - PRWHE to be assess     Time  2    Period  Weeks    Status  On-going    Target Date  07/05/18      OT LONG TERM GOAL #3   Title  Grip strengthn and prehension improve with more than 50% compare to L hand for pt to return to shooting     Baseline  progressing  Time  2    Period  Weeks    Status  On-going    Target Date  07/05/18            Plan - 06/21/18 1530    Clinical Impression Statement  Pt is 10 1/2 wks s/p R CMC arthroplasty - pt making great progress in thumb AROM , strength - and very little pain - tenderness over thumb  thenar improving with strengthening - but c/o dorsal ulnar wrist pain- pt to hold off on 2-3 lbs weight for wrist  this week - pt did do a lot gardening last week - and maybe strain ECU - cont to increase strength without increase pain     OT  Occupational Profile and History  Problem Focused Assessment - Including review of records relating to presenting problem    Occupational performance deficits (Please refer to evaluation for details):  ADL's;IADL's;Leisure;Social Participation;Play    Body Structure / Function / Physical Skills  Flexibility;ROM;Strength;Coordination;Edema;Scar mobility;Pain;Sensation;UE functional use;Decreased knowledge of precautions;FMC;Cardiopulmonary status limiting activity;ADL    Rehab Potential  Good    Clinical Decision Making  Several treatment options, min-mod task modification necessary    OT Frequency  --   1-2 x wk   OT Duration  2 weeks    OT Treatment/Interventions  Self-care/ADL training;Paraffin;Therapeutic exercise;Splinting;Scar mobilization;Manual Therapy;Fluidtherapy;Contrast Bath;Passive range of motion;Patient/family education    Plan  assess progress with HEP and upgrade as tolerated     OT Home Exercise Plan  see pt instruction     Consulted and Agree with Plan of Care  Patient       Patient will benefit from skilled therapeutic intervention in order to improve the following deficits and impairments:  Body Structure / Function / Physical Skills  Visit Diagnosis: Pain in right hand  Stiffness of right hand, not elsewhere classified  Stiffness of right wrist, not elsewhere classified  Muscle weakness (generalized)  Other disturbances of skin sensation    Problem List There are no active problems to display for this patient.   Oletta CohnuPreez, Kerra Guilfoil OTR/L,CLT 06/21/2018, 3:38 PM  [facility] [address] [place], [place], [address] Phone: [phone]   Fax:  [phone]  Name: John James MRN: [id number] Date of Birth: 1971-01-23

## 2018-06-21 NOTE — Patient Instructions (Signed)
Pt to hold off on 2 and 3 lbs for wrist HEP  Do wrist flexion , extention stretches   And increase to medium green putty - for gripping 12 reps  3 point grip - 2 long snakes - with CMC neoprene on  And 12 reps with putty in sausage - with CMC neoprene splint  2 x day

## 2018-06-23 ENCOUNTER — Ambulatory Visit: Payer: 59 | Admitting: Occupational Therapy

## 2018-06-23 ENCOUNTER — Other Ambulatory Visit: Payer: Self-pay

## 2018-06-23 DIAGNOSIS — M6281 Muscle weakness (generalized): Secondary | ICD-10-CM

## 2018-06-23 DIAGNOSIS — M25641 Stiffness of right hand, not elsewhere classified: Secondary | ICD-10-CM

## 2018-06-23 DIAGNOSIS — R208 Other disturbances of skin sensation: Secondary | ICD-10-CM

## 2018-06-23 DIAGNOSIS — M79641 Pain in right hand: Secondary | ICD-10-CM | POA: Diagnosis not present

## 2018-06-23 DIAGNOSIS — M25631 Stiffness of right wrist, not elsewhere classified: Secondary | ICD-10-CM

## 2018-06-23 NOTE — Therapy (Signed)
[place] [facility] [address], [place], [address] Phone: [phone]   Fax:  [phone]  Occupational Therapy Treatment  Patient Details  Name: Shuron Hackley MRN: [id number] Date of Birth: 07-08-1970 No data recorded  Encounter Date: 06/23/2018  OT End of Session - 06/23/18 1314    Visit Number  9    Number of Visits  14    Date for OT Re-Evaluation  07/04/18    OT Start Time  1110    OT Stop Time  1150    OT Time Calculation (min)  40 min    Activity Tolerance  Patient tolerated treatment well    Behavior During Therapy  [facility] for tasks assessed/performed       Past Medical History:  Diagnosis Date  . Arthritis   . Back pain   . Erectile dysfunction   . GERD (gastroesophageal reflux disease)   . Heart murmur    as a child  . History of MRSA infection 2016   left forearm from spider bite  . Hypercholesteremia   . Hypertension   . Sleep apnea     Past Surgical History:  Procedure Laterality Date  . CARPOMETACARPAL (CMC) FUSION OF THUMB Right 04/07/2018   Procedure: CARPOMETACARPAL (CMC) ARHTROPLASTY OF THUMB;  Surgeon: Kennedy Bucker, MD;  Location: [facility];  Service: Orthopedics;  Laterality: Right;  . CERVICAL FUSION  11/2016  . ELBOW SURGERY Left   . FRACTURE SURGERY    . KNEE ARTHROSCOPY WITH MEDIAL MENISECTOMY Left 01/13/2018   Procedure: KNEE ARTHROSCOPY WITH MEDIAL MENISECTOMY;  Surgeon: Kennedy Bucker, MD;  Location: [facility] ORS;  Service: Orthopedics;  Laterality: Left;  . LAPAROSCOPIC GASTRIC BYPASS    . SHOULDER SURGERY Left   . TONSILLECTOMY      There were no vitals filed for this visit.  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  Yes    Pain Score  3     Pain Location  Hand    Pain Orientation  Right    Pain Descriptors / Indicators  Aching    Pain Type  Surgical pain    Pain Onset  More than a month ago       Pt done to many sets of putty - causing some pain at hand - wrist feeling better             OT Treatments/Exercises (OP) - 06/23/18 0001      RUE Paraffin   Number Minutes Paraffin  8 Minutes    RUE Paraffin Location  Wrist    Comments  prior to soft tisuse mobs and scar massage           Scar massage done - use vibration on thumbscar with AROM of thumb  scar mobs by OT - and pt to cont with cica scar pad or  kinesiotape  soft tissue mobs to webspace - and joint mobs and gentle traction to  wrist - joint mobs - very little tightness   done  this date Graston tool nr 2 over dorsal , ulnar side of wrist - sweeping - some tightness over extensors still    Pt to cont to  hold off on 2 and 3 lbs for wrist HEP  Do wrist flexion , extention stretches gentle  Can do table slides for wrist extention and wall pushups if pain free - 10-15 reps   Cont   medium green putty - for gripping 12 reps - still one set -increase in 3 days to 2 sets  3 point grip and lat grip  - short sausage with or without  CMC neoprene on for correct mechanism for thumb   12 reps  - 2 sets pain free  2 x day  Can increase to 3 sets in 3 days       OT Education - 06/23/18 1314    Education Details  pt to hold off on weight for wrist still -and only do stretches for wrist and upgrade putty for grip / prehension     Person(s) Educated  Patient    Methods  Explanation;Demonstration;Verbal cues;Handout    Comprehension  Verbalized understanding;Returned demonstration       OT Short Term Goals - 06/21/18 1533      OT SHORT TERM GOAL #1   Title  Pt to be ind in perfroming and progressing  in AROM for thumb and wrist to [facility] with pain less than 1-2/10     Status  Achieved      OT SHORT TERM GOAL #2   Title  Pain on PRWHE improve with more than 20 points     Baseline  pain at eval on PRWHE 37/50 - assess next time - pain  at dorsal ulnar wrist more than thumb     Time  2    Period  Weeks    Status  On-going    Target Date  07/05/18        OT Long Term Goals - 06/21/18 1535      OT LONG TERM GOAL #1   Title  Pt able to participate in strengthening HEP for thumb and wrist to be able to wean out of thumb spica to wear less than 25 % of time     Status  Achieved      OT LONG TERM GOAL #2   Title  Strength in R  thumb PA , RA and wrist in all planes 5/5 without pain to increase use on function scale on PRWHE by more than 15 points     Baseline  strain with thumb PA and RA strengthenign - but ROM WNL - strength 4+/5 - PRWHE to be assess     Time  2    Period  Weeks    Status  On-going    Target Date  07/05/18      OT LONG TERM GOAL #3   Title  Grip strengthn and prehension improve with more than 50% compare to L hand for pt to return to shooting     Baseline  progressing     Time  2    Period  Weeks    Status  On-going    Target Date  07/05/18            Plan - 06/23/18 1315    Clinical Impression Statement  Pt is 11 wks s/p R CMC arthroplasty - pt making great progress in thumb AROM  and strength -but over done his putty HEP - doing to many sets - forgot his paper at clinic last time -and then wrist is better this date - was bothering him the last few times - per pt he done some gardening and pressure washing - and that maybe caused that - pt still to hold off on wrist HEP - and do putty HEP correctly without increasoing pain     OT Occupational Profile and History  Problem Focused Assessment - Including review of records relating to presenting problem    Occupational performance deficits (Please refer to evaluation for details):  ADL's;IADL's;Leisure;Social  Participation;Play    Body Structure / Function / Physical Skills  Flexibility;ROM;Strength;Coordination;Edema;Scar mobility;Pain;Sensation;UE functional use;Decreased knowledge of precautions;FMC;Cardiopulmonary status limiting activity;ADL    Rehab Potential  Good    Clinical Decision Making  Several treatment options, min-mod task modification necessary    OT Frequency  --   1-2 wk   OT Duration  2 weeks    OT Treatment/Interventions  Self-care/ADL training;Paraffin;Therapeutic exercise;Splinting;Scar mobilization;Manual Therapy;Fluidtherapy;Contrast Bath;Passive range of motion;Patient/family education    Plan  assess progress with HEP and upgrade as tolerated     OT Home Exercise Plan  see pt instruction     Consulted and Agree with Plan of Care  Patient       Patient will benefit from skilled therapeutic intervention in order to improve the following deficits and impairments:  Body Structure / Function / Physical Skills  Visit Diagnosis: Pain in right hand  Stiffness of right hand, not elsewhere classified  Stiffness of right wrist, not elsewhere classified  Muscle weakness (generalized)  Other disturbances of skin sensation    Problem List There are no active problems to display for this patient.   Oletta CohnuPreez, Wesly Whisenant OTR/L,CLT 06/23/2018, 5:03 PM  [facility] [address] [place], [place], [address] Phone: [phone]   Fax:  [phone]  Name: Katrine Coholfredo Vantil MRN: [id number] Date of Birth: 08/13/1970

## 2018-06-28 ENCOUNTER — Ambulatory Visit: Payer: 59 | Admitting: Occupational Therapy

## 2018-06-28 ENCOUNTER — Other Ambulatory Visit: Payer: Self-pay

## 2018-06-28 DIAGNOSIS — M25641 Stiffness of right hand, not elsewhere classified: Secondary | ICD-10-CM

## 2018-06-28 DIAGNOSIS — M79641 Pain in right hand: Secondary | ICD-10-CM | POA: Diagnosis not present

## 2018-06-28 DIAGNOSIS — M25631 Stiffness of right wrist, not elsewhere classified: Secondary | ICD-10-CM

## 2018-06-28 DIAGNOSIS — M6281 Muscle weakness (generalized): Secondary | ICD-10-CM

## 2018-06-28 DIAGNOSIS — R208 Other disturbances of skin sensation: Secondary | ICD-10-CM

## 2018-06-28 NOTE — Patient Instructions (Signed)
Cont to work on wrist extention stretch , wall pushups  And putty green for grip , lat and 3 point grip

## 2018-06-28 NOTE — Therapy (Signed)
[place] [facility] MEDICINE [address], [place], [address] Phone: [phone]   Fax:  [phone]  Occupational Therapy Treatment  Patient Details  Name: John James MRN: [id number] Date of Birth: Jun 17, 1970 No data recorded  Encounter Date: 06/28/2018  OT End of Session - 06/28/18 1619    Visit Number  10    Number of Visits  14    Date for OT Re-Evaluation  07/04/18    OT Start Time  1310    OT Stop Time  1400    OT Time Calculation (min)  50 min    Activity Tolerance  Patient tolerated treatment well    Behavior During Therapy  [facility] for tasks assessed/performed       Past Medical History:  Diagnosis Date  . Arthritis   . Back pain   . Erectile dysfunction   . GERD (gastroesophageal reflux disease)   . Heart murmur    as a child  . History of MRSA infection 2016   left forearm from spider bite  . Hypercholesteremia   . Hypertension   . Sleep apnea     Past Surgical History:  Procedure Laterality Date  . CARPOMETACARPAL (CMC) FUSION OF THUMB Right 04/07/2018   Procedure: CARPOMETACARPAL (CMC) ARHTROPLASTY OF THUMB;  Surgeon: Kennedy Bucker, MD;  Location: [facility] OR[facility];  Service: Orthopedics;  Laterality: Right;  . CERVICAL FUSION  11/2016  . ELBOW SURGERY Left   . FRACTURE SURGERY    . KNEE ARTHROSCOPY WITH MEDIAL MENISECTOMY Left 01/13/2018   Procedure: KNEE ARTHROSCOPY WITH MEDIAL MENISECTOMY;  Surgeon: Kennedy Bucker, MD;  Location: [facility] ORS;  Service: Orthopedics;  Laterality: Left;  . LAPAROSCOPIC GASTRIC BYPASS    . SHOULDER SURGERY Left   . TONSILLECTOMY      There were no vitals filed for this visit.  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  No/denies         [facility] OT Assessment - 06/28/18 0001      Strength   Right Hand Grip (lbs)  90   80 without    Right Hand Lateral Pinch  14 lbs   12 without splint    Right Hand 3 Point Pinch  14 lbs   14 without splint    Left Hand Grip (lbs)  105    Left Hand Lateral Pinch  25 lbs    Left Hand 3 Point Pinch  22 lbs      Pt cont to increase grip and prehension strength - cont to increase reps and sets with increase putty resistance  Hold off on weight for wrist for about week now because of irritation of ECU after doing some yard work and pressure washer   pt still wearing cmc neoprene about 50% of time - to decrease to 25% of time          OT Treatments/Exercises (OP) - 06/28/18 0001      RUE Paraffin   Number Minutes Paraffin  8 Minutes    RUE Paraffin Location  Wrist    Comments  prior to soft tissue mobs         soft tissue mobs to webspace - and joint mobs and gentle traction to wrist - joint mobs - very little tightness  done  this date Graston tool nr 2 over dorsal , ulnar side of wrist - sweeping - some tightness over extensors still   Pt to cont to  hold off on 2 and 3 lbs for wrist HEP  CPM on BTE for wrist extention - 200 sec x 2 - and pt to work on wrist extention with hand in loose fist to isolate wrist extensors   Cont medium green putty - for gripping 12 reps -  2 sets  3 point grip and lat grip  - short sausage with or without  CMC neoprene onfor correct mechanism for thumb  12 reps  - 2 sets pain free  2 x day Can increase to 3 sets if pain free - or wear CMC neoprene splint       OT Education - 06/28/18 1618    Education Details  pt to hold off on weight for wrist still -and only do stretches for wrist and upgrade putty for grip / prehension     Person(s) Educated  Patient    Methods  Explanation;Demonstration;Verbal cues;Handout    Comprehension  Verbalized  understanding;Returned demonstration       OT Short Term Goals - 06/21/18 1533      OT SHORT TERM GOAL #1   Title  Pt to be ind in perfroming and progressing in [facility]OM [facility] less than 1-2/10     Status  Achieved      OT SHORT TERM GOAL #2   Title  Pain on PRWHE improve with more than 20 points     Baseline  pain at eval on PRWHE 37/50 - assess next time - pain  at dorsal ulnar wrist more than thumb     Time  2    Period  Weeks    Status  On-going    Target Date  07/05/18        OT Long Term Goals - 06/21/18 1535      OT LONG TERM GOAL #1   Title  Pt able to participate in strengthening HEP for thumb and wrist to be able to wean out of thumb spica to wear less than 25 % of time     Status  Achieved      OT LONG TERM GOAL #2   Title  Strength in R  thumb PA , RA and wrist in all planes 5/5 without pain to increase use on function scale on PRWHE by more than 15 points     Baseline  strain with thumb PA and RA strengthenign - but ROM WNL - strength 4+/5 - PRWHE to be assess     Time  2    Period  Weeks    Status  On-going    Target Date  07/05/18      OT LONG TERM GOAL #3   Title  Grip strengthn and prehension improve with more than 50% compare to L hand for pt to return to shooting     Baseline  progressing     Time  2    Period  Weeks    Status  On-going    Target Date  07/05/18  Plan - 06/28/18 1836    Clinical Impression Statement  Pt is about 11 1/2 wks s/p R CMC arthroplasty - making great progress in thumb ROM and strenght - complain more about stiffness than pain - wrist doing better over ECU - working still on flexibility for wrist extention - bother only with some act - pt to be seen for 2-3 more visit over the next 3 wks for strengthening - pt to wean to 25% of CMC neoprene splint - pt to ask MD about thumb MP pain - OA?     OT Occupational Profile and History  Problem Focused Assessment - Including review of records  relating to presenting problem    Occupational performance deficits (Please refer to evaluation for details):  ADL's;IADL's;Leisure;Social Participation;Play    Body Structure / Function / Physical Skills  Flexibility;ROM;Strength;Coordination;Edema;Scar mobility;Pain;Sensation;UE functional use;Decreased knowledge of precautions;FMC;Cardiopulmonary status limiting activity;ADL    Clinical Decision Making  Several treatment options, min-mod task modification necessary    OT Frequency  --   1 x wks for 2 wks , biweekly    OT Duration  2 weeks    OT Treatment/Interventions  Self-care/ADL training;Paraffin;Therapeutic exercise;Splinting;Scar mobilization;Manual Therapy;Fluidtherapy;Contrast Bath;Passive range of motion;Patient/family education    Plan  assess progress with HEP and upgrade as tolerated     OT Home Exercise Plan  see pt instruction     Consulted and Agree with Plan of Care  Patient       Patient will benefit from skilled therapeutic intervention in order to improve the following deficits and impairments:  Body Structure / Function / Physical Skills  Visit Diagnosis: Pain in right hand  Stiffness of right hand, not elsewhere classified  Stiffness of right wrist, not elsewhere classified  Muscle weakness (generalized)  Other disturbances of skin sensation    Problem List There are no active problems to display for this patient.   Oletta Cohn OTR/L,CLT 06/28/2018, 6:40 PM  [place] [facility] PHYSICAL AND SPORTS MEDICINE [address] [place], [address] Phone: [phone]   Fax:  [phone]  Name: John James MRN: [id number] Date of Birth: 1970-08-07

## 2018-06-30 ENCOUNTER — Ambulatory Visit: Payer: 59 | Admitting: Occupational Therapy

## 2018-06-30 ENCOUNTER — Other Ambulatory Visit: Payer: Self-pay

## 2018-06-30 NOTE — Therapy (Signed)
[place] [facility] PHYSICAL AND SPORTS MEDICINE [address] [place], [place], [address] Phone: [phone]   Fax:  [phone]  Occupational Therapy Screen   Patient Details  Name: John James MRN: [id number] Date of Birth: [birth year] No data recorded  Encounter Date: 06/30/2018    Past Medical History:  Diagnosis Date  . Arthritis   . Back pain   . Erectile dysfunction   . GERD (gastroesophageal reflux disease)   . Heart murmur    as a child  . History of MRSA infection 2016   left forearm from spider bite  . Hypercholesteremia   . Hypertension   . Sleep apnea     Past Surgical History:  Procedure Laterality Date  . CARPOMETACARPAL (CMC) FUSION OF THUMB Right 04/07/2018   Procedure: CARPOMETACARPAL (CMC) ARHTROPLASTY OF THUMB;  Surgeon: Kennedy BuckerMenz, Michael, MD;  Location: [facility] ORS;  Service: Orthopedics;  Laterality: Right;  . CERVICAL FUSION  11/2016  . ELBOW SURGERY Left   . FRACTURE SURGERY    . KNEE ARTHROSCOPY WITH MEDIAL MENISECTOMY Left 01/13/2018   Procedure: KNEE ARTHROSCOPY WITH MEDIAL MENISECTOMY;  Surgeon: Kennedy BuckerMenz, Michael, MD;  Location: [facility] ORS;  Service: Orthopedics;  Laterality: Left;  . LAPAROSCOPIC GASTRIC BYPASS    . SHOULDER SURGERY Left   . TONSILLECTOMY      There were no vitals filed for this visit.  Subjective Assessment - 06/30/18 1131    Subjective   Seen Dr Rosita KeaMenz - at end of session - he looked at my thumb and gave me shot - said if not working - and do quick surgery for it        Upon review of chart prior to visit - pt seen surgeon yesterday and had shot at thumb A1pulley -  [facility] - pt report he did not do to many HEP last night  Do have some swelling over middle joint at thumb  Pt to hold off on any resistance HEP to thumb - only AROM for thumb PA and RA  And wrist  No contrast - only some ice for swelling  Can cont with HEP Monday - will follow up next week                       OT Short Term Goals - 06/21/18 1533      OT SHORT TERM GOAL #1   Title  Pt to be ind in perfroming and progressing in AROM for thumb and wrist [facility] with pain less than 1-2/10     Status  Achieved      OT SHORT TERM GOAL #2   Title  Pain on PRWHE improve with more than 20 points     Baseline  pain at eval on PRWHE 37/50 - assess next time - pain  at dorsal ulnar wrist more than thumb     Time  2    Period  Weeks    Status  On-going    Target Date  07/05/18        OT Long Term Goals - 06/21/18 1535      OT LONG TERM GOAL #1   Title  Pt able to participate in strengthening HEP for thumb and wrist to be able to wean out of thumb spica to wear less than 25 % of time     Status  Achieved      OT LONG TERM GOAL #2   Title  Strength in R  thumb PA , RA and wrist in all planes 5/5 without pain to increase use on function scale on PRWHE by more than 15 points     Baseline  strain with thumb PA and RA strengthenign - but ROM WNL - strength 4+/5 - PRWHE to be assess     Time  2    Period  Weeks    Status  On-going    Target Date  07/05/18      OT LONG TERM GOAL #3   Title  Grip strengthn and prehension improve with more than 50% compare to L hand for pt to return to shooting     Baseline  progressing     Time  2    Period  Weeks    Status  On-going    Target Date  07/05/18              Patient will benefit from skilled therapeutic intervention in order to improve the following deficits and impairments:     Visit Diagnosis: Pain in right hand  Stiffness of right hand, not elsewhere classified    Problem List There are no active problems to display for this patient.   Oletta Cohn OTR/L,CLT 06/30/2018, 11:32 AM  [facility] PHYSICAL AND SPORTS MEDICINE [address], [place], [address] Phone: [phone]   Fax:  [phone]  Name: Marce Goodlow MRN: [id number] Date of  Birth: August 17, 1970

## 2018-07-06 ENCOUNTER — Other Ambulatory Visit: Payer: Self-pay

## 2018-07-06 ENCOUNTER — Ambulatory Visit: Payer: 59 | Admitting: Occupational Therapy

## 2018-07-06 DIAGNOSIS — R208 Other disturbances of skin sensation: Secondary | ICD-10-CM

## 2018-07-06 DIAGNOSIS — M25631 Stiffness of right wrist, not elsewhere classified: Secondary | ICD-10-CM

## 2018-07-06 DIAGNOSIS — M79641 Pain in right hand: Secondary | ICD-10-CM

## 2018-07-06 DIAGNOSIS — M25641 Stiffness of right hand, not elsewhere classified: Secondary | ICD-10-CM

## 2018-07-06 DIAGNOSIS — M6281 Muscle weakness (generalized): Secondary | ICD-10-CM

## 2018-07-06 NOTE — Therapy (Signed)
[place] [facility]TS [facility]ICINE [address] [place], [address] Phone: [phone]   Fax:  [phone]  Occupational Therapy Treatment  Patient Details  Name: John James MRN: [id number] Date of Birth: [birth year] No data recorded  Encounter Date: 07/06/2018  OT End of Session - 07/06/18 1446    Visit Number  11    Number of Visits  14    Date for OT Re-Evaluation  08/03/18    OT Start Time  1302    OT Stop Time  1344    OT Time Calculation (min)  42 min    Activity Tolerance  Patient tolerated treatment well    Behavior During Therapy  [facility] for tasks assessed/performed       Past Medical History:  Diagnosis Date  . Arthritis   . Back pain   . Erectile dysfunction   . GERD (gastroesophageal reflux disease)   . Heart murmur    as a child  . History of MRSA infection 2016   left forearm from spider bite  . Hypercholesteremia   . Hypertension   . Sleep apnea     Past Surgical History:  Procedure Laterality Date  . CARPOMETACARPAL (CMC) FUSION OF THUMB Right 04/07/2018   Procedure: CARPOMETACARPAL (CMC) ARHTROPLASTY OF THUMB;  Surgeon: Kennedy Bucker, MD;  Location: [facility] ORS;  Service: Orthopedics;  Laterality: Right;  . CERVICAL FUSION  11/2016  . ELBOW SURGERY Left   . FRACTURE SURGERY    . KNEE ARTHROSCOPY WITH MEDIAL MENISECTOMY Left 01/13/2018   Procedure: KNEE ARTHROSCOPY WITH MEDIAL MENISECTOMY;  Surgeon: Kennedy Bucker, MD;  Location: [facility] ORS;  Service: Orthopedics;  Laterality: Left;  . LAPAROSCOPIC GASTRIC BYPASS    . SHOULDER SURGERY Left   . TONSILLECTOMY      There were no vitals filed for this visit.  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  Yes    Pain Score  1     Pain Location  Hand    Pain Orientation  Right    Pain Descriptors / Indicators  Aching    Pain Type  Surgical pain    Pain Onset  More than a month ago    Pain Frequency  Intermittent         [facility] OT Assessment - 07/06/18 0001      Strength   Right Hand Grip (lbs)  80    Right Hand Lateral Pinch  14 lbs    Right Hand 3 Point Pinch  14 lbs    Left Hand Grip (lbs)  105    Left Hand Lateral Pinch  25 lbs    Left Hand 3 Point Pinch  22 lbs           soft tissue mobs to webspace - donethis date Graston tool nr 2 over dorsal , ulnar side of wrist - sweeping - some tightness over extensors still Scar massage - using graston  tool nr 2 for brushing , vibration and xtractor - distal part of scar still adhere but do not hinder AROM of thumb flexion appear     Pt some pain with stabilization of thumb during grip or lateral push during grip and twisting -  Upgrade HEP with putty        Green putty - gripping , twisting and pulling with all fingers and thumb in opposition  2 x12 reps  Pinch alternate digits - 12 reps   2 x day  Increase to 3 sets in 3-4 days        OT Education - 07/06/18 1445    Education Details  upgrade putty for  functional grip / prehension  strength     Person(s) Educated  Patient    Methods  Explanation;Demonstration;Verbal cues;Handout    Comprehension  Verbalized understanding;Returned demonstration       OT Short Term Goals - 07/06/18 1448      OT SHORT TERM GOAL #2   Title  Pain on PRWHE improve with more than 20 points     Baseline  pain at eval on PRWHE 37/50 - Thumb pain 10/50  on PRWHE     Status  Achieved        OT Long Term Goals - 07/06/18 1449      OT LONG TERM GOAL #1   Title  Pt able to participate in strengthening HEP for thumb and wrist to be able to wean  out of thumb spica to wear less than 25 % of time     Status  Achieved      OT LONG TERM GOAL #2   Title  Strength in R  thumb PA , RA and wrist in all planes 5/5 without pain to increase use on function scale on PRWHE by more than 15 points     Baseline  Pt stll having some pain with grip and twisting when thumb ADD  or flex in - pressure - PRWHE function improveing     Time  4    Period  Weeks    Status  On-going    Target Date  08/03/18      OT LONG TERM GOAL #3   Title  Grip strengthn and prehension improve with more than 50% compare to L hand for pt to return to shooting     Baseline  grip and prehension improve greatly but not shooting yet     Time  4    Period  Weeks    Status  On-going    Target Date  08/03/18            Plan - 07/06/18 1446    Clinical Impression Statement  Pt is about 12 1/2 wks s/p R CMC arthroplasty - pt making great progress but still some pain with use of thumb during grip and twisting act - add this date and upgrade functional grip and prehension strengthening to thumb and hand - pt wearing CMC neoprene splint only 25 % or less -and pt to no over do thumb flexion and opposition - had shot last week in A1pulley for thumb - less edema in thumb     OT Occupational Profile and History  Problem Focused Assessment - Including review of records relating to presenting problem    Occupational performance deficits (Please refer to evaluation for details):  ADL's;IADL's;Leisure;Social Participation;Play    Body Structure / Function / Physical Skills  Flexibility;ROM;Strength;Coordination;Edema;Scar mobility;Pain;Sensation;UE functional use;Decreased knowledge of precautions;FMC;Cardiopulmonary  status limiting activity;ADL    Rehab Potential  Good    Clinical Decision Making  Several treatment options, min-mod task modification necessary    Comorbidities Affecting Occupational Performance:  None    Modification or Assistance to Complete Evaluation   No  modification of tasks or assist necessary to complete eval    OT Frequency  1x / week   1 x wkd and biweekly   OT Duration  4 weeks    OT Treatment/Interventions  Self-care/ADL training;Paraffin;Therapeutic exercise;Splinting;Scar mobilization;Manual Therapy;Fluidtherapy;Contrast Bath;Passive range of motion;Patient/family education    Plan  assess progress with HEP and upgrade as tolerated     OT Home Exercise Plan  see pt instruction     Consulted and Agree with Plan of Care  Patient       Patient will benefit from skilled therapeutic intervention in order to improve the following deficits and impairments:  Body Structure / Function / Physical Skills  Visit Diagnosis: Pain in right hand - Plan: Ot plan of care cert/re-cert  Stiffness of right hand, not elsewhere classified - Plan: Ot plan of care cert/re-cert  Stiffness of right wrist, not elsewhere classified - Plan: Ot plan of care cert/re-cert  Muscle weakness (generalized) - Plan: Ot plan of care cert/re-cert  Other disturbances of skin sensation - Plan: Ot plan of care cert/re-cert    Problem List There are no active problems to display for this patient.   Oletta CohnuPreez, Sneijder Bernards OTR/L,CLT 07/06/2018, 2:53 PM  [place] [facility] MEDICINE [address] [place], [place], [address] Phone: [phone]   Fax:  [phone]  Name: John James MRN: [id number] Date of Birth: Feb 03, 1971

## 2018-07-06 NOTE — Patient Instructions (Signed)
Green putty - gripping , twisting and pulling with all fingers and thumb in opposition  2 x12 reps  Pinch alternate digits - 12 reps   2 x day  Increase to 3 sets in 3-4 days

## 2018-07-14 ENCOUNTER — Ambulatory Visit: Payer: 59 | Attending: Orthopedic Surgery | Admitting: Occupational Therapy

## 2019-03-07 ENCOUNTER — Encounter: Payer: Self-pay | Admitting: Occupational Therapy

## 2019-03-27 ENCOUNTER — Ambulatory Visit: Payer: PRIVATE HEALTH INSURANCE | Attending: Orthopedic Surgery | Admitting: Occupational Therapy

## 2019-03-27 ENCOUNTER — Other Ambulatory Visit: Payer: Self-pay

## 2019-03-27 DIAGNOSIS — M25641 Stiffness of right hand, not elsewhere classified: Secondary | ICD-10-CM | POA: Diagnosis not present

## 2019-03-27 DIAGNOSIS — M6281 Muscle weakness (generalized): Secondary | ICD-10-CM | POA: Diagnosis present

## 2019-03-27 DIAGNOSIS — M25631 Stiffness of right wrist, not elsewhere classified: Secondary | ICD-10-CM | POA: Diagnosis present

## 2019-03-27 NOTE — Therapy (Signed)
[place] PHYSICAL AND SPORTS MEDICINE [address] [place], [address] Phone: [phone]   Fax:  [phone]  Occupational Therapy Treatment/Reassessment  Patient Details  Name: John James MRN: [id number] Date of Birth: [birth year] No data recorded  Encounter Date: 03/27/2019  OT End of Session - 03/27/19 1513    Visit Number  12    Number of Visits  16    Date for OT Re-Evaluation  06/19/19    OT Start Time  1030    OT Stop Time  1111    OT Time Calculation (min)  41 min    Activity Tolerance  Patient tolerated treatment well    Behavior During Therapy  [facility] for tasks assessed/performed       Past Medical History:  Diagnosis Date  . Arthritis   . Back pain   . Erectile dysfunction   . GERD (gastroesophageal reflux disease)   . Heart murmur    as a child  . History of MRSA infection 2016   left forearm from spider bite  . Hypercholesteremia   . Hypertension   . Sleep apnea     Past Surgical History:  Procedure Laterality Date  . CARPOMETACARPAL ([place]) FUSION OF THUMB Right 04/07/2018   Procedure: CARPOMETACARPAL ([place]) ARHTROPLASTY OF THUMB;  Surgeon: Hessie Knows, MD;  Location: [facility] OR[facility];  Service: Orthopedics;  Laterality: Right;  . CERVICAL FUSION  11/2016  . ELBOW SURGERY Left   . FRACTURE SURGERY    . KNEE ARTHROSCOPY WITH MEDIAL MENISECTOMY Left 01/13/2018   Procedure: KNEE ARTHROSCOPY WITH MEDIAL MENISECTOMY;  Surgeon: Hessie Knows, MD;  Location: [facility] ORS;  Service: Orthopedics;  Laterality: Left;  . LAPAROSCOPIC GASTRIC BYPASS    . SHOULDER SURGERY Left   . TONSILLECTOMY      There were no vitals filed for this visit.  Subjective Assessment - 03/27/19 1258    Subjective   I had seen Dr Rudene Christians in Dec - I stil have pain when pushing thru my palm to do pushup or grip something heavy - right here if I push with my finger on it - tender - he said the xray showed it is right wear the achor is    Pertinent  History  Pt had 1/302020 done [facility] arthroplasty done and in June had trigger thumb done - OT treatment was stopped in April 2020 - pt report still pain at base of thumb with pushup or gripping    Patient Stated Goals  Want to be able to flatten my palm or thumb  - like doing pushup without pain, holding golfclub and shoot    Currently in Pain?  Yes    Pain Score  8     Pain Location  Hand    Pain Orientation  Right    Pain Descriptors / Indicators  Aching;Tender    Pain Type  Surgical pain    Pain Frequency  Intermittent    Aggravating Factors   pushing up , grip golf club or shooting         [facility] OT Assessment - 03/27/19 0001      Strength   Right Hand Grip (lbs)  85    Right Hand Lateral Pinch  19 lbs    Right Hand 3 Point Pinch  16 lbs    Left Hand Grip (lbs)  83    Left Hand Lateral Pinch  25 lbs    Left Hand 3 Point Pinch  21 lbs  3 point grip increase with MP block splint and no pain  CMC neoprene splint on - pt had less pain with push up and with MP block splint at Thumb MP  Pt do not have retro position of thumb  Add this HEP for pt and to wear splint as needed during day for functional strength  Push to be done on wall and not floor - pain free  And to work on PROM from 70 to 90  AROM only 70 degrees - that is normal   Will follow up in 3-4 wks    MC block splint for thumb fabricate and CMC neoprene splint provided   to wear as needed              OT Education - 03/27/19 1302    Education Details  findings and splint use    Person(s) Educated  Patient    Methods  Explanation;Demonstration;Verbal cues;Handout    Comprehension  Verbalized understanding;Returned demonstration       OT Short Term Goals - 07/06/18 1448      OT SHORT TERM GOAL #2   Title  Pain on PRWHE improve with more than 20 points     Baseline  pain at eval on PRWHE 37/50 - Thumb pain 10/50  on PRWHE     Status  Achieved        OT Long Term Goals - 03/27/19 1626       OT LONG TERM GOAL #2   Title  Pt to have pain less than 3/10 in pushup or gripping tight with R hand and thumb    Baseline  pain with push up 8/10 and 3/10 with grip    Time  12    Period  Weeks    Status  New    Target Date  06/19/19            Plan - 03/27/19 1611    Clinical Impression Statement  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 - pt was seen by this OT earlier last year - pt return this date with reports of pain with weight bearing thru palm - pressure thru thenar eminence - tender wear tight rope anchor are - pt do not have retro position of thumb - and during RA thenar eminence unable to get into retro position - pain with push up - with MP thumb block splint pt had increase of 4 lbs in 3 point grip ,and with that and neoprene CMC splint pt had less pain , -pt to wear splint and work on REtroposition of thumb , and push up on wall not floor or table - follow up 3-4 wks    OT Occupational Profile and History  Problem Focused Assessment - Including review of records relating to presenting problem    Occupational performance deficits (Please refer to evaluation for details):  ADL's;IADL's;Leisure;Social Participation;Play    Body Structure / Function / Physical Skills  Flexibility;ROM;Strength;Coordination;Edema;Scar mobility;Pain;Sensation;UE functional use;Decreased knowledge of precautions;FMC;Cardiopulmonary status limiting activity;ADL    Rehab Potential  Fair    Clinical Decision Making  Limited treatment options, no task modification necessary    Comorbidities Affecting Occupational Performance:  May have comorbidities impacting occupational performance   year out of surgery   Modification or Assistance to Complete Evaluation   No modification of tasks or assist necessary to complete eval    OT Frequency  Monthly    OT Duration  12 weeks    OT  Treatment/Interventions  Self-care/ADL training;Paraffin;Therapeutic exercise;Splinting;Manual  Therapy;Fluidtherapy;Passive range of motion;Patient/family education    Plan  assess retro position of thumb and pain with push up    OT Home Exercise Plan  see pt instruction     Consulted and Agree with Plan of Care  Patient       Patient will benefit from skilled therapeutic intervention in order to improve the following deficits and impairments:   Body Structure / Function / Physical Skills: Flexibility, ROM, Strength, Coordination, Edema, Scar mobility, Pain, Sensation, UE functional use, Decreased knowledge of precautions, FMC, Cardiopulmonary status limiting activity, ADL       Visit Diagnosis: Stiffness of right hand, not elsewhere classified - Plan: Ot plan of care cert/re-cert  Stiffness of right wrist, not elsewhere classified - Plan: Ot plan of care cert/re-cert  Muscle weakness (generalized) - Plan: Ot plan of care cert/re-cert    Problem List There are no problems to display for this patient.   Oletta Cohn OTR/L,CLT 03/27/2019, 4:31 PM  [place] [facility] PHYSICAL AND SPORTS MEDICINE [address] [place], [address] Phone: [phone]   Fax:  [phone]  Name: John James MRN: [id number] Date of Birth: 1970-11-11

## 2019-05-01 ENCOUNTER — Ambulatory Visit: Payer: PRIVATE HEALTH INSURANCE | Attending: Orthopedic Surgery | Admitting: Occupational Therapy

## 2019-05-01 ENCOUNTER — Other Ambulatory Visit: Payer: Self-pay

## 2019-05-01 DIAGNOSIS — M25631 Stiffness of right wrist, not elsewhere classified: Secondary | ICD-10-CM

## 2019-05-01 DIAGNOSIS — R209 Unspecified disturbances of skin sensation: Secondary | ICD-10-CM | POA: Diagnosis present

## 2019-05-01 DIAGNOSIS — M79641 Pain in right hand: Secondary | ICD-10-CM

## 2019-05-01 DIAGNOSIS — M6281 Muscle weakness (generalized): Secondary | ICD-10-CM | POA: Diagnosis present

## 2019-05-01 DIAGNOSIS — R208 Other disturbances of skin sensation: Secondary | ICD-10-CM

## 2019-05-01 DIAGNOSIS — M25641 Stiffness of right hand, not elsewhere classified: Secondary | ICD-10-CM

## 2019-05-01 NOTE — Patient Instructions (Signed)
Same splint use with functional tasks  Soft tissue to webspace of thumb and with soft tissue thumb spreads   Back of hand on thigh and attempt AROM and place and hold retrograde extention of thumb  Isometric for thumb PA - to contract thumb ABD -  Thumb RA with IP in flexion- doing  extention of base of thumb - getting extention of thumb thenar eminence  2 x 10reps  3 x day Quality more important than quantity

## 2019-05-01 NOTE — Therapy (Signed)
[facility] [address], [place], [address] Phone: [phone]   Fax:  [phone]  Occupational Therapy Treatment  Patient Details  Name: John James MRN: [id number] Date of Birth: [birth year] No data recorded  Encounter Date: 05/01/2019  OT End of Session - 05/01/19 1034    Visit Number  13    Number of Visits  16    Date for OT Re-Evaluation  06/19/19    OT Start Time  1034    OT Stop Time  1116    OT Time Calculation (min)  42 min    Activity Tolerance  Patient tolerated treatment well    Behavior During Therapy  [facility] for tasks assessed/performed       Past Medical History:  Diagnosis Date  . Arthritis   . Back pain   . Erectile dysfunction   . GERD (gastroesophageal reflux disease)   . Heart murmur    as a child  . History of MRSA infection 2016   left forearm from spider bite  . Hypercholesteremia   . Hypertension   . Sleep apnea     Past Surgical History:  Procedure Laterality Date  . CARPOMETACARPAL (CMC) FUSION OF THUMB Right 04/07/2018   Procedure: CARPOMETACARPAL (CMC) ARHTROPLASTY OF THUMB;  Surgeon: Kennedy Bucker, MD;  Location: [facility] ORS;  Service: Orthopedics;  Laterality: Right;  . CERVICAL FUSION  11/2016  . ELBOW SURGERY Left   . FRACTURE SURGERY    . KNEE ARTHROSCOPY WITH MEDIAL MENISECTOMY Left 01/13/2018   Procedure: KNEE ARTHROSCOPY WITH MEDIAL MENISECTOMY;  Surgeon: Kennedy Bucker, MD;  Location: [facility] ORS;  Service: Orthopedics;  Laterality: Left;  . LAPAROSCOPIC GASTRIC BYPASS    . SHOULDER SURGERY Left   . TONSILLECTOMY      There were no vitals filed for this visit.  Subjective Assessment - 05/01/19 1223    Subjective   Still pain with pressure or weight bearing - lost the small splint for week - but found it - still cannot get my thumb out to side like my other hand    Pertinent History  Pt had 1/302020 done [facility] arthroplasty done and in June had trigger thumb  done - OT treatment was stopped in April 2020 - pt report still pain at base of thumb with pushup or gripping    Patient Stated Goals  Want to be able to flatten my palm or thumb  - like doing pushup without pain, holding golfclub and shoot    Currently in Pain?  Yes    Pain Score  --   no number but weight bearing pain at base of thenar eminence      Assess retro position of thumb - unable and not using EPB -  using EPL - with hyper extention of MP of thumb  Discuss with pt findings  And atropy of thenar eminence - could explain tenderness over anchor - pt to work on and let him fell to use APB - and do isometric   Pt did had increase 3 point grip  And lat grip with use of MC block splint - increase of 4 lbs and no pain      Pt do not have retro position of thumb - to do with palm up and use gravity and EPL Add this HEP for pt and to wear splint as needed during day for functional strength  Soft tissue to webspace of thumb and with soft tissue thumb spreads  Back of hand on thigh and attempt AROM and place and hold retrograde extention of thumb  Isometric for thumb PA - to contract thumb ABD -  Thumb RA with IP in flexion- doing  extention of base of thumb - getting extention of thumb thenar eminence  2 x 10reps  3 x day Quality more important than quantity   Will follow up in 3-4 wks  MC block splint for thumb fabricate and CMC neoprene splint provided   to wear as needed                   OT Education - 05/01/19 1226    Education Details  progress and HEP    Person(s) Educated  Patient    Methods  Explanation;Demonstration;Verbal cues;Handout    Comprehension  Verbalized understanding;Returned demonstration       OT Short Term Goals - 07/06/18 1448      OT SHORT TERM GOAL #2   Title  Pain on PRWHE improve with more than 20 points     Baseline  pain at eval on PRWHE 37/50 - Thumb pain 10/50  on PRWHE     Status  Achieved        OT Long Term Goals  - 03/27/19 1626      OT LONG TERM GOAL #2   Title  Pt to have pain less than 3/10 in pushup or gripping tight with R hand and thumb    Baseline  pain with push up 8/10 and 3/10 with grip    Time  12    Period  Weeks    Status  New    Target Date  06/19/19            Plan - 05/01/19 1035    Clinical Impression Statement  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 - pt was seen by this OT earlier last year - pt return month ago with reports of pain with weight bearing thru palm - pressure thru thenar eminence - tender wear tight rope anchor are - pt do not have retro position of thumb - and during RA using EPL and hyper extention of MC - pt to flex and use ECB to extend  thenar eminence  - pt to use MP thumb block splint  or CMC neoprene with functional tasks - pt had increase of 4 lbs in 3 point grip ,and with that and neoprene CMC splint pt had less pain , Pt do have athropy of APB - to do isometric , and work on retro position of thumb with gravity  - follow up 3-4 wks    OT Occupational Profile and History  Problem Focused Assessment - Including review of records relating to presenting problem    Occupational performance deficits (Please refer to evaluation for details):  ADL's;IADL's;Leisure;Social Participation;Play    Body Structure / Function / Physical Skills  Flexibility;ROM;Strength;Coordination;Edema;Scar mobility;Pain;Sensation;UE functional use;Decreased knowledge of precautions;FMC;Cardiopulmonary status limiting activity;ADL    Rehab Potential  Fair    Clinical Decision Making  Limited treatment options, no task modification necessary    Comorbidities Affecting Occupational Performance:  May have comorbidities impacting occupational performance    Modification or Assistance to Complete Evaluation   No modification of tasks or assist necessary to complete eval    OT Frequency  Monthly    OT Duration  12 weeks    OT Treatment/Interventions  Self-care/ADL  training;Paraffin;Therapeutic exercise;Splinting;Manual Therapy;Fluidtherapy;Passive range of motion;Patient/family education  Plan  assess retro position of thumb and thenar eminence and use of EPB,    OT Home Exercise Plan  see pt instruction     Consulted and Agree with Plan of Care  Patient       Patient will benefit from skilled therapeutic intervention in order to improve the following deficits and impairments:   Body Structure / Function / Physical Skills: Flexibility, ROM, Strength, Coordination, Edema, Scar mobility, Pain, Sensation, UE functional use, Decreased knowledge of precautions, FMC, Cardiopulmonary status limiting activity, ADL       Visit Diagnosis: Stiffness of right wrist, not elsewhere classified  Muscle weakness (generalized)  Pain in right hand  Other disturbances of skin sensation  Stiffness of right hand, not elsewhere classified    Problem List There are no problems to display for this patient.   Rosalyn Gess OTR/L, CLT 05/01/2019, 12:33 PM  [facility] [address] [place], [address] Phone: [phone]   Fax:  [phone]  Name: Geremiah Fussell MRN: [id number] Date of Birth: 12-Jun-1970

## 2019-05-29 ENCOUNTER — Ambulatory Visit: Payer: PRIVATE HEALTH INSURANCE | Attending: Orthopedic Surgery | Admitting: Occupational Therapy

## 2019-06-05 ENCOUNTER — Encounter: Payer: PRIVATE HEALTH INSURANCE | Admitting: Occupational Therapy

## 2019-06-12 ENCOUNTER — Ambulatory Visit: Payer: PRIVATE HEALTH INSURANCE | Attending: Orthopedic Surgery | Admitting: Occupational Therapy

## 2020-08-22 ENCOUNTER — Other Ambulatory Visit: Payer: Self-pay | Admitting: Orthopedic Surgery

## 2020-09-10 ENCOUNTER — Inpatient Hospital Stay
Admission: RE | Admit: 2020-09-10 | Discharge: 2020-09-10 | Disposition: A | Discharge status: Home / Self Care | Payer: PRIVATE HEALTH INSURANCE | Source: Ambulatory Visit

## 2020-09-10 NOTE — Patient Instructions (Addendum)
Your procedure is scheduled on: Thursday September 19, 2020. Report to Day Surgery inside [facility] 2nd floor (stop by admissions desk first before getting on elevator). To find out your arrival time please call [phone] between 1PM - 3PM on Wednesday September 18, 2020.  Remember: Instructions that are not followed completely may result in serious medical risk,  up to and including death, or upon the discretion of your surgeon and anesthesiologist your  surgery may need to be rescheduled.     _X__ 1. Do not eat food after midnight the night before your procedure.                 No chewing gum or hard candies. You may drink clear liquids up to 2 hours                 before you are scheduled to arrive for your surgery- DO not drink clear                 liquids within 2 hours of the start of your surgery.                 Clear Liquids include:  water, apple juice without pulp, clear Gatorade, G2 or                  Gatorade Zero (avoid Red/Purple/Blue), Black Coffee or Tea (Do not add                 anything to coffee or tea).  __X__2.   Complete the "Ensure Clear Pre-surgery Clear Carbohydrate Drink" provided to you, 2 hours before arrival. **If you are diabetic you will be provided with an alternative drink, Gatorade Zero or G2.  __X__3.  On the morning of surgery brush your teeth with toothpaste and water, you                may rinse your mouth with mouthwash if you wish.  Do not swallow any toothpaste of mouthwash.     _X__ 4.  No Alcohol for 24 hours before or after surgery.   _X__ 5.  Do Not Smoke or use e-cigarettes For 24 Hours Prior to Your Surgery.                 Do not use any chewable tobacco products for at least 6 hours prior to                 Surgery.  _X__  6.  Do not use any recreational drugs (marijuana, cocaine, heroin, ecstasy, MDMA or other)                For at least one week prior to your surgery.  Combination of these drugs with  anesthesia                May have life threatening results.  __X__  7.  Notify your doctor if there is any change in your medical condition      (cold, fever, infections).     Do not wear jewelry, make-up, hairpins, clips or nail polish. Do not wear lotions, powders, or perfumes. You may wear deodorant. Do not shave 48 hours prior to surgery. Men may shave face and neck. Do not bring valuables to the hospital.    [facility] is not responsible for any belongings or valuables.  Contacts, dentures or bridgework may not be worn into surgery. Leave your suitcase in the car. After  surgery it may be brought to your room. For patients admitted to the hospital, discharge time is determined by your treatment team.   Patients discharged the day of surgery will not be allowed to drive home.   Make arrangements for someone to be with you for the first 24 hours of your Same Day Discharge.    Please read over the following fact sheets that you were given:   Total Joint Packet    __X__ Take these medicines the morning of surgery with A SIP OF WATER:    1. None   2.   3.   4.  5.  6.  ____ Fleet Enema (as directed)   __X__ Use CHG Soap (or wipes) as directed  ____ Use Benzoyl Peroxide Gel as instructed  ____ Use inhalers on the day of surgery  ____ Stop metformin 2 days prior to surgery    ____ Take 1/2 of usual insulin dose the night before surgery. No insulin the morning          of surgery.   ____ Call your PCP, cardiologist, or Pulmonologist if taking Coumadin/Plavix/aspirin and ask when to stop before your surgery.   __X__ One Week prior to surgery- Stop Anti-inflammatories such as Ibuprofen, Aleve, Advil, Motrin, meloxicam (MOBIC), diclofenac, etodolac, ketorolac, Toradol, Daypro, piroxicam, Goody's or BC powders. OK TO USE TYLENOL IF NEEDED   __X__ Stop supplements until after surgery.    ____ Bring C-Pap to the hospital.    If you have any questions regarding your  pre-procedure instructions,  Please call Pre-admit Testing at [phone].

## 2020-09-17 ENCOUNTER — Other Ambulatory Visit: Payer: PRIVATE HEALTH INSURANCE

## 2020-10-07 ENCOUNTER — Other Ambulatory Visit: Payer: Self-pay

## 2020-10-07 ENCOUNTER — Encounter
Admission: RE | Admit: 2020-10-07 | Discharge: 2020-10-07 | Disposition: A | Discharge status: Home / Self Care | Payer: BLUE CROSS/BLUE SHIELD | Source: Ambulatory Visit | Attending: Orthopedic Surgery | Admitting: Orthopedic Surgery

## 2020-10-07 DIAGNOSIS — I4510 Unspecified right bundle-branch block: Secondary | ICD-10-CM | POA: Diagnosis not present

## 2020-10-07 DIAGNOSIS — Z01818 Encounter for other preprocedural examination: Secondary | ICD-10-CM | POA: Insufficient documentation

## 2020-10-07 HISTORY — DX: Personal history of urinary calculi: Z87.442

## 2020-10-07 LAB — CBC WITH DIFFERENTIAL/PLATELET
Abs Immature Granulocytes: 0.04 10*3/uL (ref 0.00–0.07)
Basophils Absolute: 0.1 10*3/uL (ref 0.0–0.1)
Basophils Relative: 1 %
Eosinophils Absolute: 0.1 10*3/uL (ref 0.0–0.5)
Eosinophils Relative: 2 %
HCT: 47.4 % (ref 39.0–52.0)
Hemoglobin: 16.4 g/dL (ref 13.0–17.0)
Immature Granulocytes: 1 %
Lymphocytes Relative: 31 %
Lymphs Abs: 2.2 10*3/uL (ref 0.7–4.0)
MCH: 28.8 pg (ref 26.0–34.0)
MCHC: 34.6 g/dL (ref 30.0–36.0)
MCV: 83.3 fL (ref 80.0–100.0)
Monocytes Absolute: 0.7 10*3/uL (ref 0.1–1.0)
Monocytes Relative: 9 %
Neutro Abs: 4 10*3/uL (ref 1.7–7.7)
Neutrophils Relative %: 56 %
Platelets: 381 10*3/uL (ref 150–400)
RBC: 5.69 MIL/uL (ref 4.22–5.81)
RDW: 12.8 % (ref 11.5–15.5)
WBC: 7 10*3/uL (ref 4.0–10.5)
nRBC: 0 % (ref 0.0–0.2)

## 2020-10-07 LAB — COMPREHENSIVE METABOLIC PANEL
ALT: 22 U/L (ref 0–44)
AST: 25 U/L (ref 15–41)
Albumin: 4.4 g/dL (ref 3.5–5.0)
Alkaline Phosphatase: 58 U/L (ref 38–126)
Anion gap: 11 (ref 5–15)
BUN: 20 mg/dL (ref 6–20)
CO2: 26 mmol/L (ref 22–32)
Calcium: 9.2 mg/dL (ref 8.9–10.3)
Chloride: 99 mmol/L (ref 98–111)
Creatinine, Ser: 1.18 mg/dL (ref 0.61–1.24)
GFR, Estimated: >60 mL/min (ref >60)
Glucose, Bld: 111 mg/dL — ABNORMAL HIGH (ref 70–99)
Potassium: 3.6 mmol/L (ref 3.5–5.1)
Sodium: 136 mmol/L (ref 135–145)
Total Bilirubin: 1.1 mg/dL (ref 0.3–1.2)
Total Protein: 7.6 g/dL (ref 6.5–8.1)

## 2020-10-07 LAB — URINALYSIS, ROUTINE W REFLEX MICROSCOPIC
Bilirubin Urine: NEGATIVE
Glucose, UA: NEGATIVE mg/dL
Hgb urine dipstick: NEGATIVE
Ketones, ur: NEGATIVE mg/dL
Leukocytes,Ua: NEGATIVE
Nitrite: NEGATIVE
Protein, ur: NEGATIVE mg/dL
Specific Gravity, Urine: 1.018 (ref 1.005–1.030)
pH: 5 (ref 5.0–8.0)

## 2020-10-07 LAB — TYPE AND SCREEN
ABO/RH(D): A POS
Antibody Screen: NEGATIVE

## 2020-10-07 LAB — SURGICAL PCR SCREEN
MRSA, PCR: NEGATIVE
Staphylococcus aureus: NEGATIVE

## 2020-10-07 NOTE — Patient Instructions (Addendum)
Your procedure is scheduled on: Thursday October 17, 2020. Report to Day Surgery. To find out your arrival time please call [phone] between 1PM - 3PM on Wednesday October 16, 2020.  Remember: Instructions that are not followed completely may result in serious medical risk,  up to and including death, or upon the discretion of your surgeon and anesthesiologist your  surgery may need to be rescheduled.     _X__ 1. Do not eat food after midnight the night before your procedure.                 No chewing gum or hard candies. You may drink clear liquids up to 2 hours                 before you are scheduled to arrive for your surgery- DO not drink clear                 liquids within 2 hours of the start of your surgery.                 Clear Liquids include:  water, apple juice without pulp, clear Gatorade, G2 or                  Gatorade Zero (avoid Red/Purple/Blue), Black Coffee or Tea (Do not add                 anything to coffee or tea).  __X__2.   Complete the "Ensure Clear Pre-surgery Clear Carbohydrate Drink" provided to you, 2 hours before arrival. **If you are diabetic you will be provided with an alternative drink, Gatorade Zero or G2.  __X__3.  On the morning of surgery brush your teeth with toothpaste and water, you                may rinse your mouth with mouthwash if you wish.  Do not swallow any toothpaste of mouthwash.     _X__ 4.  No Alcohol for 24 hours before or after surgery.   _X__ 5.  Do Not Smoke or use e-cigarettes For 24 Hours Prior to Your Surgery.                 Do not use any chewable tobacco products for at least 6 hours prior to                 Surgery.  _X__  6.  Do not use any recreational drugs (marijuana, cocaine, heroin, ecstasy, MDMA or other)                For at least one week prior to your surgery.  Combination of these drugs with anesthesia                May have life threatening results.  __X__  7.  Notify your  doctor if there is any change in your medical condition      (cold, fever, infections).     Do not wear jewelry, make-up, hairpins, clips or nail polish. Do not wear lotions, powders, or perfumes. You may wear deodorant. Do not shave 48 hours prior to surgery. Men may shave face and neck. Do not bring valuables to the hospital.    [facility] is not responsible for any belongings or valuables.  Contacts, dentures or bridgework may not be worn into surgery. Leave your suitcase in the car. After surgery it may be brought to your room. For patients admitted to the hospital,  discharge time is determined by your treatment team.   Patients discharged the day of surgery will not be allowed to drive home.   Make arrangements for someone to be with you for the first 24 hours of your Same Day Discharge.   __X__ Take these medicines the morning of surgery with A SIP OF WATER:    1. atorvastatin (LIPITOR) 10 MG  2. amLODipine (NORVASC) 2.5 MG  3.   4.  5.  6.  ____ Fleet Enema (as directed)   __X__ Use CHG Soap (or wipes) as directed  ____ Use Benzoyl Peroxide Gel as instructed  ____ Use inhalers on the day of surgery  ____ Stop metformin 2 days prior to surgery    ____ Take 1/2 of usual insulin dose the night before surgery. No insulin the morning          of surgery.   ____ Call your PCP, cardiologist, or Pulmonologist if taking Coumadin/Plavix/aspirin and ask when to stop before your surgery.   __X__ One Week prior to surgery- Stop Anti-inflammatories such as Ibuprofen, Aleve, Advil, Motrin, meloxicam (MOBIC), diclofenac, etodolac, ketorolac, Toradol, nabumetone (RELAFEN) Daypro, piroxicam, Goody's or BC powders. OK TO USE TYLENOL IF NEEDED   ____ Stop supplements until after surgery.    __X__ Bring C-Pap to the hospital.    If you have any questions regarding your pre-procedure instructions,  Please call Pre-admit Testing at [phone].

## 2020-10-15 ENCOUNTER — Other Ambulatory Visit: Payer: PRIVATE HEALTH INSURANCE

## 2020-10-16 ENCOUNTER — Other Ambulatory Visit: Payer: Self-pay

## 2020-10-16 ENCOUNTER — Other Ambulatory Visit
Admission: RE | Admit: 2020-10-16 | Discharge: 2020-10-16 | Disposition: A | Discharge status: Home / Self Care | Payer: BLUE CROSS/BLUE SHIELD | Source: Ambulatory Visit | Attending: Orthopedic Surgery | Admitting: Orthopedic Surgery

## 2020-10-16 DIAGNOSIS — Z01812 Encounter for preprocedural laboratory examination: Secondary | ICD-10-CM | POA: Insufficient documentation

## 2020-10-16 DIAGNOSIS — Z20822 Contact with and (suspected) exposure to covid-19: Secondary | ICD-10-CM | POA: Insufficient documentation

## 2020-10-16 LAB — SARS CORONAVIRUS 2 (TAT 6-24 HRS): SARS Coronavirus 2: NEGATIVE

## 2020-10-16 NOTE — Anesthesia Preprocedure Evaluation (Addendum)
Anesthesia Evaluation  Patient identified by MRN, date of birth, ID band Patient awake    Reviewed: Allergy & Precautions, NPO status , Patient's Chart, lab work & pertinent test results  History of Anesthesia Complications Negative for: history of anesthetic complications  Airway Mallampati: II  TM Distance: >3 FB Neck ROM: Full    Dental  (+) Teeth Intact   Pulmonary sleep apnea and Continuous Positive Airway Pressure Ventilation , neg COPD,    Pulmonary exam normal        Cardiovascular Exercise Tolerance: Good hypertension, Pt. on medications Normal cardiovascular exam+ Valvular Problems/Murmurs      Neuro/Psych neg Seizures S/p C3-7 fusion with rare radiculopathy negative neurological ROS  negative psych ROS   GI/Hepatic Neg liver ROS, GERD  Controlled,  Endo/Other  neg diabetesObese  Renal/GU negative Renal ROS  negative genitourinary   Musculoskeletal  (+) Arthritis , Osteoarthritis,    Abdominal (+) + obese,   Peds negative pediatric ROS (+)  Hematology   Anesthesia Other Findings Anterior neck scar  Reproductive/Obstetrics                            Anesthesia Physical  Anesthesia Plan  ASA: 2  Anesthesia Plan: Spinal   Post-op Pain Management:    Induction: Intravenous  PONV Risk Score and Plan: 2 and Ondansetron, TIVA and Propofol infusion  Airway Management Planned: Natural Airway and Nasal Cannula  Additional Equipment:   Intra-op Plan:   Post-operative Plan:   Informed Consent: I have reviewed the patients History and Physical, chart, labs and discussed the procedure including the risks, benefits and alternatives for the proposed anesthesia with the patient or authorized representative who has indicated his/her understanding and acceptance.     Dental advisory given  Plan Discussed with: Anesthesiologist and CRNA  Anesthesia Plan Comments: (Incomplete  Note)       Anesthesia Quick Evaluation

## 2020-10-17 ENCOUNTER — Encounter
Admission: RE | Disposition: A | Discharge status: Home / Self Care | Payer: Self-pay | Source: Home / Self Care | Attending: Orthopedic Surgery

## 2020-10-17 ENCOUNTER — Observation Stay
Admission: RE | Admit: 2020-10-17 | Discharge: 2020-10-18 | Disposition: A | Discharge status: Home / Self Care | Payer: BLUE CROSS/BLUE SHIELD | Attending: Orthopedic Surgery | Admitting: Orthopedic Surgery

## 2020-10-17 ENCOUNTER — Ambulatory Visit: Payer: BLUE CROSS/BLUE SHIELD | Admitting: Anesthesiology

## 2020-10-17 ENCOUNTER — Encounter: Payer: Self-pay | Admitting: Orthopedic Surgery

## 2020-10-17 ENCOUNTER — Other Ambulatory Visit: Payer: Self-pay

## 2020-10-17 ENCOUNTER — Observation Stay: Payer: BLUE CROSS/BLUE SHIELD

## 2020-10-17 ENCOUNTER — Ambulatory Visit: Payer: BLUE CROSS/BLUE SHIELD

## 2020-10-17 DIAGNOSIS — Z96641 Presence of right artificial hip joint: Secondary | ICD-10-CM

## 2020-10-17 DIAGNOSIS — I10 Essential (primary) hypertension: Secondary | ICD-10-CM | POA: Diagnosis not present

## 2020-10-17 DIAGNOSIS — Z79899 Other long term (current) drug therapy: Secondary | ICD-10-CM | POA: Insufficient documentation

## 2020-10-17 DIAGNOSIS — M1611 Unilateral primary osteoarthritis, right hip: Principal | ICD-10-CM | POA: Insufficient documentation

## 2020-10-17 DIAGNOSIS — Z419 Encounter for procedure for purposes other than remedying health state, unspecified: Secondary | ICD-10-CM

## 2020-10-17 DIAGNOSIS — G8918 Other acute postprocedural pain: Secondary | ICD-10-CM

## 2020-10-17 HISTORY — PX: TOTAL HIP ARTHROPLASTY: SHX124

## 2020-10-17 LAB — CBC
HCT: 40.4 % (ref 39.0–52.0)
Hemoglobin: 14.1 g/dL (ref 13.0–17.0)
MCH: 29.6 pg (ref 26.0–34.0)
MCHC: 34.9 g/dL (ref 30.0–36.0)
MCV: 84.9 fL (ref 80.0–100.0)
Platelets: 257 10*3/uL (ref 150–400)
RBC: 4.76 MIL/uL (ref 4.22–5.81)
RDW: 12.2 % (ref 11.5–15.5)
WBC: 15.2 10*3/uL — ABNORMAL HIGH (ref 4.0–10.5)
nRBC: 0 % (ref 0.0–0.2)

## 2020-10-17 LAB — ABO/RH: ABO/RH(D): A POS

## 2020-10-17 LAB — CREATININE, SERUM
Creatinine, Ser: 0.8 mg/dL (ref 0.61–1.24)
GFR, Estimated: >60 mL/min (ref >60)

## 2020-10-17 SURGERY — ARTHROPLASTY, HIP, TOTAL, ANTERIOR APPROACH
Anesthesia: Spinal | Site: Hip | Laterality: Right

## 2020-10-17 MED ORDER — PROPOFOL 10 MG/ML IV BOLUS
INTRAVENOUS | Status: DC | PRN
  Administered 2020-10-17: 50 mg via INTRAVENOUS
  Administered 2020-10-17: 100 mg via INTRAVENOUS

## 2020-10-17 MED ORDER — SODIUM CHLORIDE 0.9 % IV SOLN: INTRAVENOUS | Status: DC

## 2020-10-17 MED ORDER — BISACODYL 10 MG RE SUPP: 10.0000 mg | Freq: Every day | RECTAL | Status: DC | PRN

## 2020-10-17 MED ORDER — ORAL CARE MOUTH RINSE: 15.0000 mL | Freq: Once | OROMUCOSAL | Status: AC

## 2020-10-17 MED ORDER — DIPHENHYDRAMINE HCL 12.5 MG/5ML PO ELIX: 12.5000 mg | ORAL_SOLUTION | ORAL | Status: DC | PRN

## 2020-10-17 MED ORDER — FAMOTIDINE 20 MG PO TABS
ORAL_TABLET | ORAL | Status: AC
  Administered 2020-10-17: 20 mg via ORAL
  Filled 2020-10-17: qty 1

## 2020-10-17 MED ORDER — CEFAZOLIN SODIUM-DEXTROSE 2-4 GM/100ML-% IV SOLN
2.0000 g | INTRAVENOUS | Status: AC
  Administered 2020-10-17: 2 g via INTRAVENOUS

## 2020-10-17 MED ORDER — FAMOTIDINE 20 MG PO TABS: 20.0000 mg | ORAL_TABLET | Freq: Once | ORAL | Status: AC

## 2020-10-17 MED ORDER — VASOPRESSIN 20 UNIT/ML IV SOLN
INTRAVENOUS | Status: DC | PRN
  Administered 2020-10-17 (×4): 2 [IU] via INTRAVENOUS

## 2020-10-17 MED ORDER — TRAMADOL HCL 50 MG PO TABS
50.0000 mg | ORAL_TABLET | Freq: Four times a day (QID) | ORAL | Status: DC
Start: 2020-10-17 — End: 2020-10-18
  Administered 2020-10-17 – 2020-10-18 (×4): 50 mg via ORAL
  Filled 2020-10-17 (×4): qty 1

## 2020-10-17 MED ORDER — PROMETHAZINE HCL 25 MG/ML IJ SOLN: 6.2500 mg | INTRAMUSCULAR | Status: DC | PRN

## 2020-10-17 MED ORDER — HYDROCHLOROTHIAZIDE 12.5 MG PO CAPS
12.5000 mg | ORAL_CAPSULE | Freq: Every day | ORAL | Status: DC
  Administered 2020-10-17: 12.5 mg via ORAL

## 2020-10-17 MED ORDER — CEFAZOLIN SODIUM-DEXTROSE 2-4 GM/100ML-% IV SOLN
INTRAVENOUS | Status: AC
  Filled 2020-10-17: qty 100

## 2020-10-17 MED ORDER — SODIUM CHLORIDE FLUSH 0.9 % IV SOLN
INTRAVENOUS | Status: AC
  Filled 2020-10-17: qty 40

## 2020-10-17 MED ORDER — ENOXAPARIN SODIUM 40 MG/0.4ML IJ SOSY
40.0000 mg | PREFILLED_SYRINGE | INTRAMUSCULAR | Status: DC
  Administered 2020-10-18: 40 mg via SUBCUTANEOUS
  Filled 2020-10-17: qty 0.4

## 2020-10-17 MED ORDER — PROPOFOL 500 MG/50ML IV EMUL
INTRAVENOUS | Status: DC | PRN
  Administered 2020-10-17: 120 ug/kg/min via INTRAVENOUS

## 2020-10-17 MED ORDER — CEFAZOLIN SODIUM-DEXTROSE 2-4 GM/100ML-% IV SOLN
2.0000 g | Freq: Four times a day (QID) | INTRAVENOUS | Status: AC
Start: 2020-10-17 — End: 2020-10-17
  Administered 2020-10-17 (×2): 2 g via INTRAVENOUS
  Filled 2020-10-17 (×2): qty 100

## 2020-10-17 MED ORDER — HYDROCODONE-ACETAMINOPHEN 5-325 MG PO TABS
1.0000 | ORAL_TABLET | ORAL | Status: DC | PRN
  Administered 2020-10-17 – 2020-10-18 (×3): 2 via ORAL
  Filled 2020-10-17 (×3): qty 2

## 2020-10-17 MED ORDER — BUPIVACAINE HCL (PF) 0.5 % IJ SOLN
INTRAMUSCULAR | Status: DC | PRN
  Administered 2020-10-17: 2.5 mL

## 2020-10-17 MED ORDER — MORPHINE SULFATE (PF) 2 MG/ML IV SOLN: 0.5000 mg | INTRAVENOUS | Status: DC | PRN

## 2020-10-17 MED ORDER — AMLODIPINE BESYLATE 5 MG PO TABS: 2.5000 mg | ORAL_TABLET | Freq: Every day | ORAL | Status: DC

## 2020-10-17 MED ORDER — HEMOSTATIC AGENTS (NO CHARGE) OPTIME
TOPICAL | Status: DC | PRN
  Administered 2020-10-17: 2 via TOPICAL

## 2020-10-17 MED ORDER — FLEET ENEMA 7-19 GM/118ML RE ENEM: 1.0000 | ENEMA | Freq: Once | RECTAL | Status: DC | PRN

## 2020-10-17 MED ORDER — CYCLOBENZAPRINE HCL 10 MG PO TABS
10.0000 mg | ORAL_TABLET | Freq: Three times a day (TID) | ORAL | Status: DC | PRN
  Administered 2020-10-17 – 2020-10-18 (×2): 10 mg via ORAL
  Filled 2020-10-17 (×2): qty 1

## 2020-10-17 MED ORDER — ONDANSETRON HCL 4 MG/2ML IJ SOLN: 4.0000 mg | Freq: Four times a day (QID) | INTRAMUSCULAR | Status: DC | PRN

## 2020-10-17 MED ORDER — FENTANYL CITRATE (PF) 100 MCG/2ML IJ SOLN
INTRAMUSCULAR | Status: AC
  Administered 2020-10-17: 50 ug via INTRAVENOUS
  Filled 2020-10-17: qty 2

## 2020-10-17 MED ORDER — PANTOPRAZOLE SODIUM 40 MG PO TBEC
40.0000 mg | DELAYED_RELEASE_TABLET | Freq: Every day | ORAL | Status: DC
  Administered 2020-10-18: 40 mg via ORAL
  Filled 2020-10-17: qty 1

## 2020-10-17 MED ORDER — ACETAMINOPHEN 10 MG/ML IV SOLN
INTRAVENOUS | Status: AC
  Filled 2020-10-17: qty 100

## 2020-10-17 MED ORDER — ZOLPIDEM TARTRATE 5 MG PO TABS: 5.0000 mg | ORAL_TABLET | Freq: Every evening | ORAL | Status: DC | PRN

## 2020-10-17 MED ORDER — DOCUSATE SODIUM 100 MG PO CAPS
100.0000 mg | ORAL_CAPSULE | Freq: Two times a day (BID) | ORAL | Status: DC
  Administered 2020-10-17 – 2020-10-18 (×3): 100 mg via ORAL
  Filled 2020-10-17 (×2): qty 1

## 2020-10-17 MED ORDER — DROPERIDOL 2.5 MG/ML IJ SOLN
0.6250 mg | Freq: Once | INTRAMUSCULAR | Status: DC | PRN
  Filled 2020-10-17: qty 2

## 2020-10-17 MED ORDER — OXYCODONE HCL 5 MG PO TABS
5.0000 mg | ORAL_TABLET | Freq: Once | ORAL | Status: AC
  Administered 2020-10-17: 5 mg via ORAL

## 2020-10-17 MED ORDER — NEOMYCIN-POLYMYXIN B GU 40-200000 IR SOLN
Status: DC | PRN
  Administered 2020-10-17: 4 mL

## 2020-10-17 MED ORDER — MEPERIDINE HCL 25 MG/ML IJ SOLN: 6.2500 mg | INTRAMUSCULAR | Status: DC | PRN

## 2020-10-17 MED ORDER — IRBESARTAN 150 MG PO TABS: 150.0000 mg | ORAL_TABLET | Freq: Every day | ORAL | Status: DC

## 2020-10-17 MED ORDER — HYDROCODONE-ACETAMINOPHEN 7.5-325 MG PO TABS
1.0000 | ORAL_TABLET | ORAL | Status: DC | PRN
  Administered 2020-10-17 – 2020-10-18 (×2): 2 via ORAL
  Administered 2020-10-18: 1 via ORAL
  Filled 2020-10-17: qty 1
  Filled 2020-10-17 (×2): qty 2

## 2020-10-17 MED ORDER — FENTANYL CITRATE (PF) 100 MCG/2ML IJ SOLN
INTRAMUSCULAR | Status: AC
  Administered 2020-10-17: 25 ug via INTRAVENOUS
  Filled 2020-10-17: qty 2

## 2020-10-17 MED ORDER — MIDAZOLAM HCL 5 MG/5ML IJ SOLN
INTRAMUSCULAR | Status: DC | PRN
  Administered 2020-10-17: 2 mg via INTRAVENOUS

## 2020-10-17 MED ORDER — SODIUM CHLORIDE 0.9 % IR SOLN
Status: DC | PRN
  Administered 2020-10-17: 500 mL

## 2020-10-17 MED ORDER — SODIUM CHLORIDE 0.9 % IV SOLN
INTRAVENOUS | Status: DC | PRN
  Administered 2020-10-17: 50 ug/min via INTRAVENOUS

## 2020-10-17 MED ORDER — NEOMYCIN-POLYMYXIN B GU 40-200000 IR SOLN
Status: AC
  Filled 2020-10-17: qty 20

## 2020-10-17 MED ORDER — OXYCODONE HCL 5 MG PO TABS
5.0000 mg | ORAL_TABLET | Freq: Once | ORAL | Status: AC | PRN
  Administered 2020-10-17: 5 mg via ORAL

## 2020-10-17 MED ORDER — BUPIVACAINE-EPINEPHRINE (PF) 0.25% -1:200000 IJ SOLN
INTRAMUSCULAR | Status: AC
  Filled 2020-10-17: qty 30

## 2020-10-17 MED ORDER — ATORVASTATIN CALCIUM 10 MG PO TABS
10.0000 mg | ORAL_TABLET | Freq: Every day | ORAL | Status: DC
  Administered 2020-10-18: 10 mg via ORAL
  Filled 2020-10-17: qty 1

## 2020-10-17 MED ORDER — OXYCODONE HCL 5 MG/5ML PO SOLN: 5.0000 mg | Freq: Once | ORAL | Status: AC | PRN

## 2020-10-17 MED ORDER — ONDANSETRON HCL 4 MG PO TABS
4.0000 mg | ORAL_TABLET | Freq: Four times a day (QID) | ORAL | Status: DC | PRN
  Administered 2020-10-17: 4 mg via ORAL
  Filled 2020-10-17: qty 1

## 2020-10-17 MED ORDER — PHENYLEPHRINE HCL (PRESSORS) 10 MG/ML IV SOLN
INTRAVENOUS | Status: DC | PRN
  Administered 2020-10-17: 150 ug via INTRAVENOUS
  Administered 2020-10-17: 200 ug via INTRAVENOUS
  Administered 2020-10-17: 150 ug via INTRAVENOUS

## 2020-10-17 MED ORDER — ACETAMINOPHEN 10 MG/ML IV SOLN: 1000.0000 mg | Freq: Once | INTRAVENOUS | Status: DC | PRN

## 2020-10-17 MED ORDER — CHLORHEXIDINE GLUCONATE 0.12 % MT SOLN: 15.0000 mL | Freq: Once | OROMUCOSAL | Status: AC

## 2020-10-17 MED ORDER — POLYETHYLENE GLYCOL 3350 17 G PO PACK
17.0000 g | PACK | Freq: Every day | ORAL | Status: DC | PRN
  Administered 2020-10-18: 17 g via ORAL
  Filled 2020-10-17: qty 1

## 2020-10-17 MED ORDER — ALUM & MAG HYDROXIDE-SIMETH 200-200-20 MG/5ML PO SUSP: 30.0000 mL | ORAL | Status: DC | PRN

## 2020-10-17 MED ORDER — PROPOFOL 1000 MG/100ML IV EMUL
INTRAVENOUS | Status: AC
  Filled 2020-10-17: qty 100

## 2020-10-17 MED ORDER — PHENOL 1.4 % MT LIQD
1.0000 | OROMUCOSAL | Status: DC | PRN
  Filled 2020-10-17: qty 177

## 2020-10-17 MED ORDER — VASOPRESSIN 20 UNIT/ML IV SOLN
INTRAVENOUS | Status: AC
  Filled 2020-10-17: qty 1

## 2020-10-17 MED ORDER — FENTANYL CITRATE (PF) 100 MCG/2ML IJ SOLN
25.0000 ug | INTRAMUSCULAR | Status: DC | PRN
  Administered 2020-10-17: 25 ug via INTRAVENOUS
  Administered 2020-10-17 (×2): 50 ug via INTRAVENOUS

## 2020-10-17 MED ORDER — MENTHOL 3 MG MT LOZG
1.0000 | LOZENGE | OROMUCOSAL | Status: DC | PRN
  Filled 2020-10-17: qty 9

## 2020-10-17 MED ORDER — OXYCODONE HCL 5 MG PO TABS
ORAL_TABLET | ORAL | Status: AC
  Filled 2020-10-17: qty 2

## 2020-10-17 MED ORDER — MIDAZOLAM HCL 2 MG/2ML IJ SOLN
INTRAMUSCULAR | Status: AC
  Filled 2020-10-17: qty 2

## 2020-10-17 MED ORDER — SODIUM CHLORIDE (PF) 0.9 % IJ SOLN: INTRAMUSCULAR | Status: DC | PRN

## 2020-10-17 MED ORDER — 0.9 % SODIUM CHLORIDE (POUR BTL) OPTIME
TOPICAL | Status: DC | PRN
  Administered 2020-10-17 (×2): 1000 mL

## 2020-10-17 MED ORDER — CHLORHEXIDINE GLUCONATE 0.12 % MT SOLN
OROMUCOSAL | Status: AC
  Administered 2020-10-17: 15 mL via OROMUCOSAL
  Filled 2020-10-17: qty 15

## 2020-10-17 MED ORDER — LACTATED RINGERS IV SOLN: INTRAVENOUS | Status: DC

## 2020-10-17 MED ORDER — ACETAMINOPHEN 10 MG/ML IV SOLN
INTRAVENOUS | Status: DC | PRN
  Administered 2020-10-17: 1000 mg via INTRAVENOUS

## 2020-10-17 MED ORDER — ACETAMINOPHEN 325 MG PO TABS: 325.0000 mg | ORAL_TABLET | Freq: Four times a day (QID) | ORAL | Status: DC | PRN

## 2020-10-17 MED ORDER — OLMESARTAN MEDOXOMIL-HCTZ 20-12.5 MG PO TABS: 1.0000 | ORAL_TABLET | Freq: Every day | ORAL | Status: DC

## 2020-10-17 MED ORDER — BUPIVACAINE LIPOSOME 1.3 % IJ SUSP
INTRAMUSCULAR | Status: AC
  Filled 2020-10-17: qty 20

## 2020-10-17 SURGICAL SUPPLY — 63 items
BLADE SAGITTAL AGGR TOOTH XLG (BLADE) ×2 IMPLANT
BNDG COHESIVE 6X5 TAN ST LF (GAUZE/BANDAGES/DRESSINGS) ×4 IMPLANT
CANISTER SUCT 1200ML W/VALVE (MISCELLANEOUS) IMPLANT
CANISTER WOUND CARE 500ML ATS (WOUND CARE) ×2 IMPLANT
CHLORAPREP W/TINT 26 (MISCELLANEOUS) ×2 IMPLANT
COVER BACK TABLE REUSABLE LG (DRAPES) ×2 IMPLANT
DRAPE 3/4 80X56 (DRAPES) ×4 IMPLANT
DRAPE C-ARM XRAY 36X54 (DRAPES) ×2 IMPLANT
DRAPE INCISE IOBAN 66X60 STRL (DRAPES) IMPLANT
DRAPE POUCH INSTRU U-SHP 10X18 (DRAPES) ×2 IMPLANT
DRESSING SURGICEL FIBRLLR 1X2 (HEMOSTASIS) ×2 IMPLANT
DRSG MEPILEX SACRM 8.7X9.8 (GAUZE/BANDAGES/DRESSINGS) ×2 IMPLANT
DRSG OPSITE POSTOP 4X8 (GAUZE/BANDAGES/DRESSINGS) IMPLANT
DRSG SURGICEL FIBRILLAR 1X2 (HEMOSTASIS) ×4
ELECT BLADE 6.5 EXT (BLADE) ×2 IMPLANT
ELECT REM PT RETURN 9FT ADLT (ELECTROSURGICAL) ×2
ELECTRODE REM PT RTRN 9FT ADLT (ELECTROSURGICAL) ×1 IMPLANT
GAUZE 4X4 16PLY ~~LOC~~+RFID DBL (SPONGE) ×2 IMPLANT
GLOVE SURG SYN 9.0  PF PI (GLOVE) ×2
GLOVE SURG SYN 9.0 PF PI (GLOVE) ×2 IMPLANT
GLOVE SURG UNDER POLY LF SZ9 (GLOVE) ×2 IMPLANT
GOWN SRG 2XL LVL 4 RGLN SLV (GOWNS) ×1 IMPLANT
GOWN STRL NON-REIN 2XL LVL4 (GOWNS) ×1
GOWN STRL REUS W/ TWL LRG LVL3 (GOWN DISPOSABLE) ×1 IMPLANT
GOWN STRL REUS W/TWL LRG LVL3 (GOWN DISPOSABLE) ×1
HEAD FEMORAL 28MM SZ M (Head) ×2 IMPLANT
HEMOVAC 400CC 10FR (MISCELLANEOUS) IMPLANT
HIP DBL LINER 54X28 (Liner) ×2 IMPLANT
HOLDER FOLEY CATH W/STRAP (MISCELLANEOUS) ×2 IMPLANT
HOOD PEEL AWAY FLYTE STAYCOOL (MISCELLANEOUS) ×2 IMPLANT
IRRIGATION SURGIPHOR STRL (IV SOLUTION) IMPLANT
KIT PREVENA INCISION MGT 13 (CANNISTER) ×2 IMPLANT
MANIFOLD NEPTUNE II (INSTRUMENTS) ×2 IMPLANT
MASTERLOC HIP LATERAL S6 (Hips) ×2 IMPLANT
MAT ABSORB  FLUID 56X50 GRAY (MISCELLANEOUS) ×1
MAT ABSORB FLUID 56X50 GRAY (MISCELLANEOUS) ×1 IMPLANT
NDL SAFETY ECLIPSE 18X1.5 (NEEDLE) ×1 IMPLANT
NEEDLE HYPO 18GX1.5 SHARP (NEEDLE) ×1
NEEDLE SPNL 20GX3.5 QUINCKE YW (NEEDLE) ×4 IMPLANT
NS IRRIG 1000ML POUR BTL (IV SOLUTION) ×4 IMPLANT
PACK HIP COMPR (MISCELLANEOUS) ×2 IMPLANT
SCALPEL PROTECTED #10 DISP (BLADE) ×4 IMPLANT
SHELL ACETABULAR SZ 54 DM (Shell) ×2 IMPLANT
SOL PREP PVP 2OZ (MISCELLANEOUS) ×2
SOLUTION PREP PVP 2OZ (MISCELLANEOUS) ×1 IMPLANT
SPONGE DRAIN TRACH 4X4 STRL 2S (GAUZE/BANDAGES/DRESSINGS) ×2 IMPLANT
SPONGE T-LAP 18X18 ~~LOC~~+RFID (SPONGE) ×4 IMPLANT
STAPLER SKIN PROX 35W (STAPLE) ×2 IMPLANT
STRAP SAFETY 5IN WIDE (MISCELLANEOUS) IMPLANT
SUT DVC 2 QUILL PDO  T11 36X36 (SUTURE) ×1
SUT DVC 2 QUILL PDO T11 36X36 (SUTURE) ×1 IMPLANT
SUT SILK 0 (SUTURE) ×1
SUT SILK 0 30XBRD TIE 6 (SUTURE) ×1 IMPLANT
SUT V-LOC 90 ABS DVC 3-0 CL (SUTURE) ×2 IMPLANT
SUT VIC AB 1 CT1 36 (SUTURE) ×2 IMPLANT
SYR 20ML LL LF (SYRINGE) ×2 IMPLANT
SYR 30ML LL (SYRINGE) ×2 IMPLANT
SYR 50ML LL SCALE MARK (SYRINGE) ×4 IMPLANT
SYR BULB IRRIG 60ML STRL (SYRINGE) ×2 IMPLANT
TAPE MICROFOAM 4IN (TAPE) ×2 IMPLANT
TOWEL OR 17X26 4PK STRL BLUE (TOWEL DISPOSABLE) ×2 IMPLANT
TRAY FOLEY MTR SLVR 16FR STAT (SET/KITS/TRAYS/PACK) ×2 IMPLANT
WAND WEREWOLF FASTSEAL 6.0 (MISCELLANEOUS) ×2 IMPLANT

## 2020-10-17 NOTE — Evaluation (Signed)
Physical Therapy Evaluation Patient Details Name: John James MRN: [id number] DOB: [birth year] Today's Date: 10/17/2020   History of Present Illness  Pt is a 50 y.o. male s/p R THA anterior approach 10/17/20 secondary to OA.  PMH includes sleep apnea with CPAP, htn, valvular problems/murmurs, GERD, OA, anterior neck scar, gout, sling surgery R thumb joint 2020, back surgery 2017, L elbow surgery, cervical fusion, gastric bypass, L shoulder surgery.  Clinical Impression  Prior to hospital admission, pt was independent with ambulation; lives with his wife; plans to stay on main level of home (steps to enter home).  Currently pt is min assist semi-supine to sitting edge of bed; min assist with transfers using RW; and CGA to ambulate a few feet bed to recliner with RW.  Pain 8/10 R hip at rest beginning of session and 6/10 end of session at rest (pt received pain medication prior to PT session).  Pt given warm blankets to improve comfort end of session.  Pt would benefit from skilled PT to address noted impairments and functional limitations (see below for any additional details).  Upon hospital discharge, pt would benefit from HHPT.    Follow Up Recommendations Home health PT    Equipment Recommendations  Rolling walker with 5" wheels;3in1 (PT)    Recommendations for Other Services       Precautions / Restrictions Precautions Precautions: Fall;Anterior Hip Precaution Booklet Issued: Yes (comment) Precaution Comments: Wound vac Restrictions Weight Bearing Restrictions: Yes RLE Weight Bearing: Weight bearing as tolerated      Mobility  Bed Mobility Overal bed mobility: Needs Assistance Bed Mobility: Supine to Sit     Supine to sit: Min assist;HOB elevated     General bed mobility comments: assist for R LE; vc's for technique    Transfers Overall transfer level: Needs assistance Equipment used: Rolling walker (2 wheeled) Transfers: Sit to/from Stand Sit to Stand: Min  assist         General transfer comment: assist to initiate and come to full stand; assist to control descent sitting; vc's for UE/LE placement  Ambulation/Gait Ambulation/Gait assistance: Min guard Gait Distance (Feet): 3 Feet (bed to recliner) Assistive device: Rolling walker (2 wheeled)   Gait velocity: decreased   General Gait Details: antalgic; decreased stance time R LE; decreased B LE step length; vc's to increase UE support through RW to [employer] R LE  Stairs            Wheelchair Mobility    Modified Rankin (Stroke Patients Only)       Balance Overall balance assessment: Needs assistance Sitting-balance support: No upper extremity supported;Feet supported Sitting balance-Leahy Scale: Good Sitting balance - Comments: steady sitting reaching within BOS   Standing balance support: Single extremity supported Standing balance-Leahy Scale: Fair Standing balance comment: steady static standing with at least single UE support                             Pertinent Vitals/Pain Pain Assessment: 0-10 Pain Score: 6  Pain Location: R hip Pain Descriptors / Indicators: Tender;Sore;Grimacing;Discomfort Pain Intervention(s): Limited activity within patient's tolerance;Monitored during session;Premedicated before session;Repositioned Vitals stable and WFL throughout treatment session.    Home Living Family/patient expects to be discharged to:: Private residence Living Arrangements: Spouse/significant other Available Help at Discharge: Family;Available 24 hours/day Type of Home: House Home Access: Stairs to enter   [employer] of Steps: 3 STE with pillars for support (front entrance) Home Layout: Multi-level;Able  to live on main level with bedroom/bathroom Home Equipment: None      Prior Function Level of Independence: Independent         Comments: No recent falls reported.     Hand Dominance        Extremity/Trunk Assessment    Upper Extremity Assessment Upper Extremity Assessment: Overall WFL for tasks assessed    Lower Extremity Assessment Lower Extremity Assessment: RLE deficits/detail (L LE WFL) RLE Deficits / Details: at least 3/5 AROM ankle DF/PF; fair R quad set strength; at least 2+/5 R hip flexion (limited d/t R hip pain) RLE: Unable to fully assess due to pain RLE Sensation: decreased light touch    Cervical / Trunk Assessment Cervical / Trunk Assessment: Normal  Communication   Communication: No difficulties  Cognition Arousal/Alertness: Awake/alert Behavior During Therapy: WFL for tasks assessed/performed Overall Cognitive Status: Within Functional Limits for tasks assessed                                        General Comments General comments (skin integrity, edema, etc.): R hip wound vac in place.  Imaging showing "Indeterminate lucent structure within the greater trochanter of the proximal right femur.  Recommend correlation with pre arthroplasty imaging. If this is a new finding or progressive finding then further evaluation with MRI of the right hip would be advised."  Therapist contacted MD Rosita Kea regarding above imaging concerns and MD Rosita Kea reports it was present on pre-op x-ray (no problems noted for pt's therapy participation).    Exercises Total Joint Exercises Ankle Circles/Pumps: AROM;Strengthening;Both;10 reps;Supine Quad Sets: AROM;Strengthening;Both;10 reps;Supine Gluteal Sets: AROM;Strengthening;Both;10 reps;Supine Short Arc Quad: AROM;Strengthening;Right;10 reps;Supine Heel Slides: AAROM;Strengthening;Right;10 reps;Supine Hip ABduction/ADduction: AAROM;Strengthening;Right;10 reps;Supine   Assessment/Plan    PT Assessment Patient needs continued PT services  PT Problem List Decreased strength;Decreased range of motion;Decreased activity tolerance;Decreased balance;Decreased mobility;Decreased knowledge of use of DME;Decreased knowledge of  precautions;Pain;Decreased skin integrity       PT Treatment Interventions DME instruction;Gait training;Stair training;Functional mobility training;Therapeutic activities;Therapeutic exercise;Balance training;Patient/family education    PT Goals (Current goals can be found in the Care Plan section)  Acute Rehab PT Goals Patient Stated Goal: to go home PT Goal Formulation: With patient Time For Goal Achievement: 10/31/20 Potential to Achieve Goals: Good    Frequency BID   Barriers to discharge        Co-evaluation               AM-PAC PT "6 Clicks" Mobility  Outcome Measure Help needed turning from your back to your side while in a flat bed without using bedrails?: None Help needed moving from lying on your back to sitting on the side of a flat bed without using bedrails?: A Little Help needed moving to and from a bed to a chair (including a wheelchair)?: A Little Help needed standing up from a chair using your arms (e.g., wheelchair or bedside chair)?: A Little Help needed to walk in hospital room?: A Little Help needed climbing 3-5 steps with a railing? : A Lot 6 Click Score: 18    End of Session Equipment Utilized During Treatment: Gait belt Activity Tolerance: Patient limited by pain Patient left: in chair;with call bell/phone within reach;with chair alarm set;with family/visitor present;with SCD's reapplied;Other (comment) (B heels floating via pillow support) Nurse Communication: Mobility status;Precautions;Weight bearing status;Other (comment) (pt's pain status) PT Visit Diagnosis: Other abnormalities of gait  and mobility (R26.89);Muscle weakness (generalized) (M62.81);Pain Pain - Right/Left: Right Pain - part of body: Hip    Time: 2836-6294 PT Time Calculation (min) (ACUTE ONLY): 49 min   Charges:   PT Evaluation $PT Eval Low Complexity: 1 Low PT Treatments $Therapeutic Exercise: 8-22 mins $Therapeutic Activity: 8-22 mins       Hendricks Limes,  PT 10/17/20, 5:25 PM

## 2020-10-17 NOTE — Anesthesia Postprocedure Evaluation (Signed)
Anesthesia Post Note  Patient: Kahle Mcqueen  Procedure(s) Performed: TOTAL HIP ARTHROPLASTY ANTERIOR APPROACH (Right: Hip)  Patient location during evaluation: PACU Anesthesia Type: Spinal Level of consciousness: awake and alert Pain management: pain level controlled Vital Signs Assessment: post-procedure vital signs reviewed and stable Respiratory status: spontaneous breathing, nonlabored ventilation and respiratory function stable Cardiovascular status: blood pressure returned to baseline and stable Postop Assessment: no apparent nausea or vomiting Anesthetic complications: no   No notable events documented.   Last Vitals:  Vitals:   10/17/20 1401 10/17/20 1417  BP: 111/71 130/90  Pulse: 67 68  Resp: 12 18  Temp:  (!) 36.3 C  SpO2: 99% 98%    Last Pain:  Vitals:   10/17/20 1356  TempSrc:   PainSc: 4                  Foye Deer

## 2020-10-17 NOTE — Op Note (Signed)
10/17/2020  12:35 PM  PATIENT:  John James  50 y.o. male  PRE-OPERATIVE DIAGNOSIS:  Primary osteoarthritis of right hip M16.11  POST-OPERATIVE DIAGNOSIS:  Primary osteoarthritis of right hip M16.11  PROCEDURE:  Procedure(s): TOTAL HIP ARTHROPLASTY ANTERIOR APPROACH (Right)  SURGEON: Leitha Schuller, MD  ASSISTANTS: None  ANESTHESIA:   spinal  EBL:  Total I/O In: 1700 [I.V.:1500; IV Piggyback:200] Out: 350 [Urine:100; Blood:250]  BLOOD ADMINISTERED:none  DRAINS:  Incisional wound VAC    LOCAL MEDICATIONS USED:  MARCAINE    and OTHER Exparel  SPECIMEN: Right femoral head  DISPOSITION OF SPECIMEN:  PATHOLOGY  COUNTS:  YES  TOURNIQUET:  * No tourniquets in log *  IMPLANTS: Medacta Master lock 6 LAT stem with 54 mm Mpact TM cup and liner with ceramic M 28 mm head  DICTATION: .Dragon Dictation   The patient was brought to the operating room and after spinal anesthesia was obtained patient was placed on the operative table with the ipsilateral foot into the Medacta attachment, contralateral leg on a well-padded table. C-arm was brought in and preop template x-ray taken. After prepping and draping in usual sterile fashion appropriate patient identification and timeout procedures were completed. Anterior approach to the hip was obtained and centered over the greater trochanter and TFL muscle. The subcutaneous tissue was incised hemostasis being achieved by electrocautery. TFL fascia was incised and the muscle retracted laterally deep retractor placed. The lateral femoral circumflex vessels were identified and ligated. The anterior capsule was exposed and a capsulotomy performed. The neck was identified and a femoral neck cut carried out with a saw. The head was removed without difficulty and showed sclerotic femoral head and acetabulum. Reaming was carried out to 52 mm and a 54 mm cup trial gave appropriate tightness to the acetabular component a 54 cup was impacted into position.  The leg was then externally rotated and ischiofemoral and pubofemoral releases carried out. The femur was sequentially broached to a size 6, , size 6 LAT plus and then 6 LAT with S head trials were placed and the final components chosen. The 6 lateralized stem was inserted along with a ceramic M 28 mm head and 54 mm liner. The hip was reduced and was stable the wound was thoroughly irrigated with fibrillar placed along the posterior capsule and medial neck. The deep fascia ws closed using a heavy Quill after infiltration of 30 cc of quarter percent Sensorcaine with epinephrine.3-0 diluted with Exparel throughout the case V-loc to close the skin with skin staples.  Incisional wound VAC applied and patient was sent to recovery in stable condition.   PLAN OF CARE: Admit for overnight observation

## 2020-10-17 NOTE — H&P (Signed)
Patience John James, [place] - 10/10/2020 10:45 AM EDT Formatting of this note is different from the original.  Chief Complaint  John James presents with   Pre-op Exam  Right THA scheduled 10/17/20 with Dr. Rosita Kea    History of the Present Illness: John James is a 50 y.o. male here today for history and physical for right total hip arthroplasty with Dr. Kennedy Bucker on 10/17/2020. John James has had 2 years of progressive right hip pain. However last couple years he has received 2 intra-articular cortisone injections into the right hip joint with the last one providing him less than 1 month worth of relief. He has been taking meloxicam daily. He describes moderate to severe groin pain that is increased depending upon weightbearing activity. John James has pain with walking short distances. Unable to walk further than 1 mile. Pain will refer down into the thigh, lateral hip. Has a hard time performing activities involving hip flexion due to groin pain. No back pain numbness tingling radicular symptoms. X-rays show advanced right hip osteoarthritis. John James is seen Dr. Rosita Kea and discussed total hip arthroplasty, agreed and consented the procedure.  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   I have reviewed past medical, surgical, social and family history, and allergies as documented in the EMR.  Past Medical History: Past Medical History:  Diagnosis Date   Arthritis   Cataract cortical, senile   Gout, joint 2015   Hypertension   Osteoarthritis 2018  Sling surgery right thumb joint 2020   Pure hypercholesterolemia 03/15/2017   Sleep apnea 2007   Past Surgical History: Past Surgical History:  Procedure Laterality Date   BACK SURGERY 2017   cervical fushoin Bilateral 11/2016   elbow surgery Left   FRACTURE SURGERY   KNEE ARTHROSCOPY 2019   KNEE ARTHROSCOPY WITH MEDIAL MENISECTOMY (Left), partial synovectomy Left 01/13/2018  Dr. Rosita Kea   LAPAROSCOPIC GASTRIC BYPASS   Right Thumb [facility]  Arthroplasty Right 04/07/2018  Dr. Rosita Kea   SHOULDER surgery Left 07/2016   TONSILLECTOMY 1977   Trigger Thumb Release. Right 08/12/2018  Dr. Rosita Kea   Past Family History: Family History  Problem Relation Age of Onset   No Known Problems Mother   Alzheimer's disease Father  Early Stages   High blood pressure (Hypertension) Father   Alzheimer's disease Maternal Grandmother  Decreased   Medications: Current Outpatient Medications Ordered in Epic  Medication Sig Dispense Refill   amLODIPine (NORVASC) 2.5 MG tablet Take 1 tablet (2.5 mg total) by mouth once daily 90 tablet 1   atorvastatin (LIPITOR) 10 MG tablet Take 1 tablet (10 mg total) by mouth once daily for 90 days 90 tablet 1   azelastine (ASTELIN) 137 mcg nasal spray Place 1 spray into both nostrils 2 (two) times daily 30 mL 1   cyclobenzaprine (FLEXERIL) 10 MG tablet Take 1 tablet (10 mg total) by mouth once daily 90 tablet 1   diazePAM (VALIUM) 5 MG tablet Take 1 tablet (5 mg total) by mouth every 12 (twelve) hours as needed 60 tablet 0   fluocinolone acetonide (DERMOTIC) 0.01 % otic drop   fluticasone propionate (FLONASE) 50 mcg/actuation nasal spray Place 2 sprays into both nostrils once daily as needed   meloxicam (MOBIC) 15 MG tablet TAKE 1 TABLET(15 MG) BY MOUTH EVERY DAY 30 tablet 0   olmesartan-hydrochlorothiazide (BENICAR HCT) 40-25 mg tablet Take 1 tablet by mouth once daily 30 tablet 11   promethazine-dextromethorphan (PROMETHAZINE-DM) 6.25-15 mg/5 mL syrup Take 5 mLs by mouth every 6 (six) hours  as needed for Cough 120 mL 0   sildenafiL (VIAGRA) 100 MG tablet TAKE 1 TABLET(100 MG) BY MOUTH EVERY DAY AS NEEDED FOR ERECTILE DYSFUNCTION 10 tablet 1   SUMAtriptan (IMITREX) 100 MG tablet TAKE 1 TABLET BY MOUTH AS DIRECTED FOR MIGRAINE, MAY TAKE A SECOND DOSE AFTER 2 HOURS IF NEEDED 30 tablet 2   zolpidem (AMBIEN) 5 MG tablet Take 1 tablet (5 mg total) by mouth nightly as needed for Sleep 30 tablet 4   No current Epic-ordered  facility-administered medications on file.   Allergies: No Known Allergies   Body mass index is 33.67 kg/m.  Review of Systems: A comprehensive 14 point ROS was performed, reviewed, and the pertinent orthopaedic findings are documented in the HPI.  Vitals:  10/10/20 1104  BP: 116/64    General Physical Examination:   General:  Well developed, well nourished, no apparent distress, normal affect, antalgic gait with no assistive devices.  HEENT: Head normocephalic, atraumatic, PERRL.   Abdomen: Soft, non tender, non distended, Bowel sounds present.  Heart: Examination of the heart reveals regular, rate, and rhythm. There is no murmur noted on ascultation. There is a normal apical pulse.  Lungs: Lungs are clear to auscultation. There is no wheeze, rhonchi, or crackles. There is normal expansion of bilateral chest walls.   Musculoskeletal Examination: On exam, right hip has 0 degrees adduction, 12 degrees internal rotation and 20 degrees external without pain.  Radiographs:  X-rays of the right hip reviewed by me from 09/22/2019 show advanced right hip osteoarthritis with near complete loss of superior acetabular joint space with large cam. Advanced sclerotic changes throughout the superior acetabulum with mild subchondral cyst formation. Moderate central joint space narrowing with inferior acetabular spurring. No evidence of acute bony abnormality or abnormal bony lesions.  Assessment: ICD-10-CM  1. Primary osteoarthritis of right hip M16.11   Plan:  86. 50 year old male with advanced right hip osteoarthritis. He is been treated with conservative treatment. Pain is to the point where it interferes with his quality life and activities daily living. Risks, benefits, complications of a right total hip arthroplasty have been discussed with the John James. John James has agreed and consented to procedure with Dr. Kennedy Bucker on 10/17/2020.    Reviewed  H+P. No changes noted.

## 2020-10-17 NOTE — Transfer of Care (Signed)
Immediate Anesthesia Transfer of Care Note  Patient: John James  Procedure(s) Performed: TOTAL HIP ARTHROPLASTY ANTERIOR APPROACH (Right: Hip)  Patient Location: PACU  Anesthesia Type:Spinal  Level of Consciousness: awake, alert  and oriented  Airway & Oxygen Therapy: Patient Spontanous Breathing and Patient connected to face mask oxygen  Post-op Assessment: Report given to RN and Post -op Vital signs reviewed and stable  Post vital signs: Reviewed and stable  Last Vitals:  Vitals Value Taken Time  BP 125/88 10/17/20 1235  Temp    Pulse 64 10/17/20 1240  Resp 15 10/17/20 1240  SpO2 98 % 10/17/20 1240  Vitals shown include unvalidated device data.  Last Pain:  Vitals:   10/17/20 0832  TempSrc: Oral  PainSc: 0-No pain         Complications: No notable events documented.

## 2020-10-17 NOTE — Anesthesia Procedure Notes (Signed)
Spinal  Patient location during procedure: OR Start time: 10/17/2020 10:05 AM End time: 10/17/2020 10:12 AM Reason for block: surgical anesthesia Staffing Performed: resident/CRNA  Resident/CRNA: Katherine Basset, CRNA Preanesthetic Checklist Completed: patient identified, IV checked, site marked, risks and benefits discussed, surgical consent, monitors and equipment checked, pre-op evaluation and timeout performed Spinal Block Patient position: sitting Prep: ChloraPrep Patient monitoring: heart rate, continuous pulse ox and blood pressure Approach: midline Location: L3-4 Injection technique: single-shot Needle Needle type: Pencan  Needle gauge: 24 G Needle length: 10 cm Assessment Sensory level: T4 Events: CSF return

## 2020-10-18 DIAGNOSIS — M1611 Unilateral primary osteoarthritis, right hip: Secondary | ICD-10-CM | POA: Diagnosis not present

## 2020-10-18 MED ORDER — IRBESARTAN 150 MG PO TABS: 150.0000 mg | ORAL_TABLET | Freq: Every day | ORAL | Status: DC

## 2020-10-18 MED ORDER — HYDROCHLOROTHIAZIDE 12.5 MG PO CAPS
12.5000 mg | ORAL_CAPSULE | Freq: Every day | ORAL | Status: DC
  Administered 2020-10-18: 12.5 mg via ORAL
  Filled 2020-10-18: qty 1

## 2020-10-18 MED ORDER — DOCUSATE SODIUM 100 MG PO CAPS: 100.0000 mg | ORAL_CAPSULE | Freq: Two times a day (BID) | ORAL | 0 refills | Status: AC

## 2020-10-18 MED ORDER — HYDROCODONE-ACETAMINOPHEN 7.5-325 MG PO TABS: 1.0000 | ORAL_TABLET | ORAL | 0 refills | Status: AC | PRN

## 2020-10-18 MED ORDER — TRAMADOL HCL 50 MG PO TABS: 50.0000 mg | ORAL_TABLET | Freq: Four times a day (QID) | ORAL | 0 refills | Status: AC

## 2020-10-18 MED ORDER — POLYETHYLENE GLYCOL 3350 17 G PO PACK: 17.0000 g | PACK | Freq: Every day | ORAL | 0 refills | Status: AC | PRN

## 2020-10-18 MED ORDER — ENOXAPARIN SODIUM 40 MG/0.4ML IJ SOSY: 40.0000 mg | PREFILLED_SYRINGE | INTRAMUSCULAR | 0 refills | Status: AC

## 2020-10-18 NOTE — Progress Notes (Signed)
Patient A & O and able to make needs known. AVS reviewed with patient who verbalized understanding via teach back re medications, follow up appointments, signs and symptoms to notify MD as well as limitations and restrictions. BSC and RW delivered to room by DME. Colace, Lovenox, Hydrocodone-acetaminophen, mirilax and tramadol were called into [id number], patient aware to pick up. Wife transporting patient home via private vehicle.

## 2020-10-18 NOTE — Discharge Instructions (Signed)

## 2020-10-18 NOTE — Progress Notes (Signed)
Physical Therapy Treatment Patient Details Name: John James MRN: [id number] DOB: 15-Nov-1970 Today's Date: 10/18/2020    History of Present Illness Pt is a 50 y.o. male s/p R THA anterior approach 10/17/20 secondary to OA.  PMH includes sleep apnea with CPAP, htn, valvular problems/murmurs, GERD, OA, anterior neck scar, gout, sling surgery R thumb joint 2020, back surgery 2017, L elbow surgery, cervical fusion, gastric bypass, L shoulder surgery.    PT Comments    Pt was sitting in recliner upon arriving. He agrees to session and is cooperative and motivated throughout. Endorses 4/10 pain however was not limited. Easily able to stand, ambulate, and perform stairs with use of RW. Pt is progressing well however will benefit from SNF to continue to progress him to PLOF. Cleared from a PT standpoint for safe DC home.   Follow Up Recommendations  Home health PT     Equipment Recommendations  Rolling walker with 5" wheels;3in1 (PT)       Precautions / Restrictions Precautions Precautions: Fall;Anterior Hip Precaution Booklet Issued: Yes (comment) Precaution Comments: Wound vac Restrictions Weight Bearing Restrictions: Yes RLE Weight Bearing: Weight bearing as tolerated    Mobility  Bed Mobility Overal bed mobility: Needs Assistance Bed Mobility: Supine to Sit     Supine to sit: Supervision;HOB elevated     General bed mobility comments: in recliner pre/post session    Transfers Overall transfer level: Needs assistance Equipment used: Rolling walker (2 wheeled) Transfers: Sit to/from Stand Sit to Stand: Supervision         General transfer comment: pt demonstrated safe ability to STS without physical assistance  Ambulation/Gait Ambulation/Gait assistance: Supervision Gait Distance (Feet): 150 Feet Assistive device: Rolling walker (2 wheeled) Gait Pattern/deviations: Step-through pattern Gait velocity: decreased   General Gait Details: no LOB or safety concerns  with ambulation with use of RW. pt will need RW an BSC prior to DC   Stairs Stairs: Yes Stairs assistance: Supervision Stair Management: Step to pattern Number of Stairs: 4 General stair comments: pt demonstrated safe ability to ascend/descend stairs without difficulty or safety concern      Balance Overall balance assessment: Needs assistance Sitting-balance support: No upper extremity supported;Feet supported Sitting balance-Leahy Scale: Good     Standing balance support: Bilateral upper extremity supported Standing balance-Leahy Scale: Good Standing balance comment: no balance deficits with ue support        Cognition Arousal/Alertness: Awake/alert Behavior During Therapy: WFL for tasks assessed/performed Overall Cognitive Status: Within Functional Limits for tasks assessed          Exercises Other Exercises Other Exercises: Pt instructed in AE/DME, falls prevention, pet care considerations, RW mgt, ADL transfer training, and compression stocking mgt; handout provided to support recall and carryover        Pertinent Vitals/Pain Pain Assessment: 0-10 Pain Score: 4  Pain Location: R hip and anterior thigh Pain Descriptors / Indicators: Tender;Sore;Grimacing;Discomfort Pain Intervention(s): Limited activity within patient's tolerance;Monitored during session;Premedicated before session;Repositioned;Ice applied    Home Living Family/patient expects to be discharged to:: Private residence Living Arrangements: Spouse/significant other Available Help at Discharge: Family;Available 24 hours/day Type of Home: House Home Access: Stairs to enter   Home Layout: Multi-level;Able to live on main level with bedroom/bathroom Home Equipment: None      Prior Function Level of Independence: Independent      Comments: No recent falls reported.   PT Goals (current goals can now be found in the care plan section) Acute Rehab PT Goals Patient Stated  Goal: to go home Progress  towards PT goals: Progressing toward goals    Frequency    BID      PT Plan Current plan remains appropriate       AM-PAC PT "6 Clicks" Mobility   Outcome Measure  Help needed turning from your back to your side while in a flat bed without using bedrails?: None Help needed moving from lying on your back to sitting on the side of a flat bed without using bedrails?: None Help needed moving to and from a bed to a chair (including a wheelchair)?: A Little Help needed standing up from a chair using your arms (e.g., wheelchair or bedside chair)?: A Little Help needed to walk in hospital room?: A Little Help needed climbing 3-5 steps with a railing? : A Little 6 Click Score: 20    End of Session Equipment Utilized During Treatment: Gait belt Activity Tolerance: Patient tolerated treatment well Patient left: in chair;with call bell/phone within reach;with chair alarm set;with family/visitor present;with SCD's reapplied;Other (comment) Nurse Communication: Mobility status PT Visit Diagnosis: Other abnormalities of gait and mobility (R26.89);Muscle weakness (generalized) (M62.81);Pain Pain - Right/Left: Right Pain - part of body: Hip     Time: 1004-1036 PT Time Calculation (min) (ACUTE ONLY): 32 min  Charges:  $Gait Training: 8-22 mins $Therapeutic Activity: 8-22 mins                     Jetta Lout PTA 10/18/20, 1:03 PM

## 2020-10-18 NOTE — Plan of Care (Signed)
  Problem: Education: Goal: Knowledge of General Education information will improve Description: Including pain rating scale, medication(s)/side effects and non-pharmacologic comfort measures Outcome: Adequate for Discharge   Problem: Health Behavior/Discharge Planning: Goal: Ability to manage health-related needs will improve Outcome: Adequate for Discharge   Problem: Clinical Measurements: Goal: Ability to maintain clinical measurements within normal limits will improve Outcome: Adequate for Discharge Goal: Will remain free from infection Outcome: Adequate for Discharge Goal: Diagnostic test results will improve Outcome: Adequate for Discharge Goal: Respiratory complications will improve Outcome: Adequate for Discharge Goal: Cardiovascular complication will be avoided Outcome: Adequate for Discharge   Problem: Activity: Goal: Risk for activity intolerance will decrease Outcome: Adequate for Discharge   Problem: Nutrition: Goal: Adequate nutrition will be maintained Outcome: Adequate for Discharge   Problem: Coping: Goal: Level of anxiety will decrease Outcome: Adequate for Discharge   Problem: Elimination: Goal: Will not experience complications related to bowel motility Outcome: Adequate for Discharge   Problem: Pain Managment: Goal: General experience of comfort will improve Outcome: Adequate for Discharge   Problem: Safety: Goal: Ability to remain free from injury will improve Outcome: Adequate for Discharge   Problem: Skin Integrity: Goal: Risk for impaired skin integrity will decrease Outcome: Adequate for Discharge   Problem: Activity: Goal: Ability to avoid complications of mobility impairment will improve Outcome: Adequate for Discharge Goal: Ability to tolerate increased activity will improve Outcome: Adequate for Discharge   Problem: Clinical Measurements: Goal: Postoperative complications will be avoided or minimized Outcome: Adequate for  Discharge   Problem: Pain Management: Goal: Pain level will decrease with appropriate interventions Outcome: Adequate for Discharge   Problem: Skin Integrity: Goal: Will show signs of wound healing Outcome: Adequate for Discharge

## 2020-10-18 NOTE — TOC Progression Note (Signed)
Transition of Care [facility]) - Progression Note    Patient Details  Name: John James MRN: [id number] Date of Birth: 09-Dec-1970  [facility] CM/SW Contact  Caryn Section, RN Phone Number: 10/18/2020, 12:51 PM  Clinical Narrative:   RNCM spoke with patient and wife.  Amenable to DC home today, [facility] in place and confirmed by [place].  Rolling walker and bedside commode delivered.  Patient and wife have no futher concerns for discharge.         Expected Discharge Plan and Services           Expected Discharge Date: 10/18/20                                     Social Determinants of Health (SDOH) Interventions    Readmission Risk Interventions No flowsheet data found.

## 2020-10-18 NOTE — Discharge Summary (Signed)
Physician Discharge Summary  Patient ID: John James MRN: [id number] DOB/AGE: 1971/01/14 50 y.o.  Admit date: 10/17/2020 Discharge date: 10/18/2020  Admission Diagnoses:  Status post total hip replacement, right [Z96.641]   Discharge Diagnoses: Patient Active Problem List   Diagnosis Date Noted   Status post total hip replacement, right 10/17/2020    Past Medical History:  Diagnosis Date   Arthritis    Back pain    Erectile dysfunction    GERD (gastroesophageal reflux disease)    Heart murmur    as a child   History of kidney stones    History of MRSA infection 2016   left forearm from spider bite   Hypercholesteremia    Hypertension    Sleep apnea      Transfusion: none   Consultants (if any):   Discharged Condition: Improved  Hospital Course: John James is an 50 y.o. male who was admitted 10/17/2020 with a diagnosis of right hip osteoarthritis and went to the operating room on 10/17/2020 and underwent the above named procedures.    Surgeries: Procedure(s): TOTAL HIP ARTHROPLASTY ANTERIOR APPROACH on 10/17/2020 Patient tolerated the surgery well. Taken to PACU where she was stabilized and then transferred to the orthopedic floor.  Started on Lovenox 40 mg q 24 hrs. Foot pumps applied bilaterally at 80 mm. Heels elevated on bed with rolled towels. No evidence of DVT. Negative Homan. Physical therapy started on day #1 for gait training and transfer. OT started day #1 for ADL and assisted devices.  Patient's foley was d/c on day #1. Patient's IV was d/c on day #2.  On post op day #1 patient was stable and ready for discharge to home with HHPT.    He was given perioperative antibiotics:  Anti-infectives (From admission, onward)    Start     Dose/Rate Route Frequency Ordered Stop   10/17/20 1600  ceFAZolin (ANCEF) IVPB 2g/100 mL premix        2 g 200 mL/hr over 30 Minutes Intravenous Every 6 hours 10/17/20 1452 10/17/20 2053   10/17/20 0820   ceFAZolin (ANCEF) 2-4 GM/100ML-% IVPB       Note to Pharmacy: Junius Creamer   : cabinet override      10/17/20 0820 10/17/20 1048   10/17/20 0600  ceFAZolin (ANCEF) IVPB 2g/100 mL premix        2 g 200 mL/hr over 30 Minutes Intravenous On call to O.R. 10/17/20 0232 10/17/20 1040     .  He was given sequential compression devices, early ambulation, and Lovenox, teds for DVT prophylaxis.  He benefited maximally from the hospital stay and there were no complications.    Recent vital signs:  Vitals:   10/18/20 0726 10/18/20 1141  BP: (!) 110/56 108/61  Pulse: 88 76  Resp: 18 18  Temp: 99.1 F (37.3 C) 98.4 F (36.9 C)  SpO2: 91% 97%    Recent laboratory studies:  Lab Results  Component Value Date   HGB 14.1 10/17/2020   HGB 16.4 10/07/2020   Lab Results  Component Value Date   WBC 15.2 (H) 10/17/2020   PLT 257 10/17/2020   No results found for: INR Lab Results  Component Value Date   NA 136 10/07/2020   K 3.6 10/07/2020   CL 99 10/07/2020   CO2 26 10/07/2020   BUN 20 10/07/2020   CREATININE 0.80 10/17/2020   GLUCOSE 111 (H) 10/07/2020    Discharge Medications:   Allergies as of 10/18/2020   No Known  Allergies      Medication List     STOP taking these medications    amLODipine 2.5 MG tablet Commonly known as: NORVASC   HYDROcodone-acetaminophen 5-325 MG tablet Commonly known as: Norco Replaced by: HYDROcodone-acetaminophen 7.5-325 MG tablet   nabumetone 750 MG tablet Commonly known as: RELAFEN       TAKE these medications    atorvastatin 10 MG tablet Commonly known as: LIPITOR Take 10 mg by mouth daily.   cyclobenzaprine 10 MG tablet Commonly known as: FLEXERIL Take 10 mg by mouth 3 (three) times daily as needed for muscle spasms.   docusate sodium 100 MG capsule Commonly known as: COLACE Take 1 capsule (100 mg total) by mouth 2 (two) times daily.   enoxaparin 40 MG/0.4ML injection Commonly known as: LOVENOX Inject 0.4 mLs (40 mg  total) into the skin daily for 14 days.   HYDROcodone-acetaminophen 7.5-325 MG tablet Commonly known as: NORCO Take 1-2 tablets by mouth every 4 (four) hours as needed for severe pain (pain score 7-10). Replaces: HYDROcodone-acetaminophen 5-325 MG tablet   olmesartan-hydrochlorothiazide 20-12.5 MG tablet Commonly known as: BENICAR HCT Take 1 tablet by mouth daily.   polyethylene glycol 17 g packet Commonly known as: MIRALAX / GLYCOLAX Take 17 g by mouth daily as needed for mild constipation.   traMADol 50 MG tablet Commonly known as: ULTRAM Take 1 tablet (50 mg total) by mouth every 6 (six) hours.               Durable Medical Equipment  (From admission, onward)           Start     Ordered   10/17/20 1453  DME Walker rolling  Once       Question Answer Comment  Walker: With 5 Inch Wheels   Patient needs a walker to treat with the following condition Status post total hip replacement, right      10/17/20 1452   10/17/20 1453  DME Bedside commode  Once       Question:  Patient needs a bedside commode to treat with the following condition  Answer:  Status post total hip replacement, right   10/17/20 1452   10/17/20 1453  DME 3 n 1  Once        10/17/20 1452            Diagnostic Studies: DG HIP OPERATIVE UNILAT WITH PELVIS RIGHT  Result Date: 10/17/2020 CLINICAL DATA:  RIGHT hip replacement EXAM: OPERATIVE RIGHT HIP (WITH PELVIS IF PERFORMED) 4 VIEWS TECHNIQUE: Fluoroscopic spot image(s) were submitted for interpretation post-operatively. COMPARISON:  None FLUOROSCOPY TIME:  0 minutes 24 seconds Dose: 9.7 mGy FINDINGS: Images demonstrate placement of a RIGHT hip prosthesis. No fracture or dislocation identified on submitted images. Tip of femoral component not imaged. IMPRESSION: Intraoperative images during RIGHT hip arthroplasty. Electronically Signed   By: Ulyses Southward M.D.   On: 10/17/2020 15:30   DG HIP UNILAT W OR W/O PELVIS 2-3 VIEWS RIGHT  Result Date:  10/17/2020 CLINICAL DATA:  Status post right hip arthroplasty. EXAM: DG HIP (WITH OR WITHOUT PELVIS) 2-3V RIGHT COMPARISON:  None. FINDINGS: Postoperative changes from right hip arthroplasty identified. The hardware components are in anatomic alignment. No periprosthetic fracture or dislocation. There is a indeterminate lucent structure within the greater trochanter of the proximal right femur measuring 2.6 x 2.0 cm. IMPRESSION: 1. Status post right hip arthroplasty. 2. Indeterminate lucent structure within the greater trochanter of the proximal right femur. Recommend correlation with  pre arthroplasty imaging. If this is a new finding or progressive finding then further evaluation with MRI of the right hip would be advised. Electronically Signed   By: Signa Kell M.D.   On: 10/17/2020 13:31    Disposition: Discharge disposition: 06-Home-Health Care Svc           Signed: Amador Cunas CHRISTOPHER 10/18/2020, 2:48 PM

## 2020-10-18 NOTE — Evaluation (Signed)
Occupational Therapy Evaluation Patient Details Name: John James MRN: [id number] DOB: [birth year] Today's Date: 10/18/2020    History of Present Illness Pt is a 50 y.o. male s/p R THA anterior approach 10/17/20 secondary to OA.  PMH includes sleep apnea with CPAP, htn, valvular problems/murmurs, GERD, OA, anterior neck scar, gout, sling surgery R thumb joint 2020, back surgery 2017, L elbow surgery, cervical fusion, gastric bypass, L shoulder surgery.   Clinical Impression   Pt seen for OT evaluation this date, POD#1 from above surgery. Pt was independent in all ADL prior to surgery, however, experiencing pain and is eager to return to PLOF with less pain and improved safety and independence. Pt currently requires minimal assist for LB dressing and bathing while in seated position due to pain and limited AROM of R hip. Pt performed bed mobility with supervision and heavy BUE support, CGA for ADL transfers + RW + VC for hand/foot placement, and able to negotiate RW from EOB to recliner with 1 VC for RW mgt. Pt instructed in self care skills, falls prevention strategies, home/routines modifications, DME/AE for LB bathing and dressing tasks, pet care considerations, and compression stocking mgt. Handout provided to support recall and carryover. Pt would benefit from additional instruction while hospitalized in self care skills and techniques to help maintain precautions with or without assistive devices to support recall and carryover prior to discharge. Do not anticipate additional skilled OT needs after hospitalization.     Follow Up Recommendations  No OT follow up    Equipment Recommendations  3 in 1 bedside commode    Recommendations for Other Services       Precautions / Restrictions Precautions Precautions: Fall;Anterior Hip Precaution Booklet Issued: Yes (comment) Precaution Comments: Wound vac Restrictions Weight Bearing Restrictions: Yes RLE Weight Bearing: Weight bearing as  tolerated      Mobility Bed Mobility Overal bed mobility: Needs Assistance Bed Mobility: Supine to Sit     Supine to sit: Supervision;HOB elevated     General bed mobility comments: increased time/effort, BUE use on bed    Transfers Overall transfer level: Needs assistance Equipment used: Rolling walker (2 wheeled) Transfers: Sit to/from Stand Sit to Stand: From elevated surface;Min guard         General transfer comment: bed elevated slightly to minimc home set up, VC for foot placement, hand placement    Balance Overall balance assessment: Needs assistance Sitting-balance support: No upper extremity supported;Feet supported Sitting balance-Leahy Scale: Good     Standing balance support: Bilateral upper extremity supported Standing balance-Leahy Scale: Fair                             ADL either performed or assessed with clinical judgement   ADL Overall ADL's : Needs assistance/impaired                                       General ADL Comments: Pt currently requires MIN A for LB ADL tasks 2/2 R hip pain and decreased strength/ROM. CGA for ADL transfers + RW. Pt reports spouse able to assist     Vision Baseline Vision/History: Wears glasses Wears Glasses: Reading only Patient Visual Report: No change from baseline       Perception     Praxis      Pertinent Vitals/Pain Pain Assessment: 0-10 Pain Score: 8  Pain Location:  R hip and anterior thigh Pain Descriptors / Indicators: Tender;Sore;Grimacing;Discomfort Pain Intervention(s): Limited activity within patient's tolerance;Monitored during session;Premedicated before session;Repositioned;Ice applied     Hand Dominance     Extremity/Trunk Assessment Upper Extremity Assessment Upper Extremity Assessment: Overall WFL for tasks assessed   Lower Extremity Assessment Lower Extremity Assessment: RLE deficits/detail RLE Deficits / Details: s/p anterior THA expected post-op  strength, ROM deficits, pain limited but functionally able to move fairly well RLE: Unable to fully assess due to pain       Communication Communication Communication: No difficulties   Cognition Arousal/Alertness: Awake/alert Behavior During Therapy: WFL for tasks assessed/performed Overall Cognitive Status: Within Functional Limits for tasks assessed                                     General Comments       Exercises Other Exercises Other Exercises: Pt instructed in AE/DME, falls prevention, pet care considerations, RW mgt, ADL transfer training, and compression stocking mgt; handout provided to support recall and carryover   Shoulder Instructions      Home Living Family/patient expects to be discharged to:: Private residence Living Arrangements: Spouse/significant other Available Help at Discharge: Family;Available 24 hours/day Type of Home: House Home Access: Stairs to enter [employer] of Steps: 3 STE with pillars for support (front entrance)   Home Layout: Multi-level;Able to live on main level with bedroom/bathroom Alternate Level Stairs-Number of Steps: will stay in bedroom on main level   Bathroom Shower/Tub: Producer, television/film/video: Standard     Home Equipment: None          Prior Functioning/Environment Level of Independence: Independent        Comments: No recent falls reported.        OT Problem List: Decreased strength;Decreased range of motion;Pain;Impaired balance (sitting and/or standing)      OT Treatment/Interventions: Self-care/ADL training;Therapeutic exercise;Therapeutic activities;DME and/or AE instruction;Patient/family education;Balance training    OT Goals(Current goals can be found in the care plan section) Acute Rehab OT Goals Patient Stated Goal: to go home OT Goal Formulation: With patient Time For Goal Achievement: 11/01/20 Potential to Achieve Goals: Good ADL Goals Pt Will Perform Lower  Body Dressing: with modified independence;sit to/from stand Pt Will Transfer to Toilet: with modified independence;ambulating (elevated commode, LRAD PRN) Additional ADL Goal #1: Pt will independently instruct family in compression stocking mgt.  OT Frequency: Min 1X/week   Barriers to D/C:            Co-evaluation              AM-PAC OT "6 Clicks" Daily Activity     Outcome Measure Help from another person eating meals?: None Help from another person taking care of personal grooming?: None Help from another person toileting, which includes using toliet, bedpan, or urinal?: A Little Help from another person bathing (including washing, rinsing, drying)?: A Little Help from another person to put on and taking off regular upper body clothing?: None Help from another person to put on and taking off regular lower body clothing?: A Little 6 Click Score: 21   End of Session Equipment Utilized During Treatment: Gait belt;Rolling walker  Activity Tolerance: Patient tolerated treatment well Patient left: in chair;with call bell/phone within reach;with chair alarm set;with SCD's reapplied;Other (comment) (wound vac in place, ice on R thigh)  OT Visit Diagnosis: Other abnormalities of gait and mobility (R26.89);Pain  Pain - Right/Left: Right Pain - part of body: Hip;Leg                Time: 2550-0164 OT Time Calculation (min): 25 min Charges:  OT General Charges $OT Visit: 1 Visit OT Evaluation $OT Eval Moderate Complexity: 1 Mod OT Treatments $Self Care/Home Management : 8-22 mins  Wynona Canes, MPH, MS, OTR/L ascom [phone] 10/18/20, 9:17 AM

## 2020-10-18 NOTE — Progress Notes (Signed)
   Subjective: 1 Day Post-Op Procedure(s) (LRB): TOTAL HIP ARTHROPLASTY ANTERIOR APPROACH (Right) Patient reports pain as moderate.   Patient is well, and has had no acute complaints or problems Denies any CP, SOB, ABD pain. We will continue therapy today.  Plan is to go Home after hospital stay.  Objective: Vital signs in last 24 hours: Temp:  [97.1 F (36.2 C)-99.1 F (37.3 C)] 99.1 F (37.3 C) (08/12 0726) Pulse Rate:  [57-92] 88 (08/12 0726) Resp:  [9-20] 18 (08/12 0726) BP: (99-139)/(56-99) 110/56 (08/12 0726) SpO2:  [91 %-100 %] 91 % (08/12 0726) Weight:  [102.1 kg] 102.1 kg (08/11 0832)  Intake/Output from previous day: 08/11 0701 - 08/12 0700 In: 3005.1 [P.O.:204; I.V.:2601.1; IV Piggyback:200] Out: 1875 [Urine:1625; Blood:250] Intake/Output this shift: No intake/output data recorded.  Recent Labs    10/17/20 1541  HGB 14.1   Recent Labs    10/17/20 1541  WBC 15.2*  RBC 4.76  HCT 40.4  PLT 257   Recent Labs    10/17/20 1541  CREATININE 0.80   No results for input(s): LABPT, INR in the last 72 hours.  EXAM General - Patient is Alert, Appropriate, and Oriented Extremity - Neurovascular intact Sensation intact distally Intact pulses distally Dorsiflexion/Plantar flexion intact No cellulitis present Compartment soft Dressing - dressing C/D/I and no drainage Motor Function - intact, moving foot and toes well on exam.   Past Medical History:  Diagnosis Date   Arthritis    Back pain    Erectile dysfunction    GERD (gastroesophageal reflux disease)    Heart murmur    as a child   History of kidney stones    History of MRSA infection 2016   left forearm from spider bite   Hypercholesteremia    Hypertension    Sleep apnea     Assessment/Plan:   1 Day Post-Op Procedure(s) (LRB): TOTAL HIP ARTHROPLASTY ANTERIOR APPROACH (Right) Active Problems:   Status post total hip replacement, right  Estimated body mass index is 33.24 kg/m as  calculated from the following:   Height as of this encounter: 5\' 9"  (1.753 m).   Weight as of this encounter: 102.1 kg. Advance diet Up with therapy Work on bowel movement We will continue with current pain regimen.  Pain moderate, but has taken limited medications for pain. Blood pressure soft, DC Norvasc and hold lisinopril.  Patient states he has not been taking the Norvasc at home due to feeling dizzy/lightheaded. Care management to assist with discharge.  Possible discharge today pending progress with PT   DVT Prophylaxis - Lovenox, Foot Pumps, and TED hose Weight-Bearing as tolerated to.  Right leg   T. , PA-C [facility] 10/18/2020, 7:51 AM

## 2020-10-22 LAB — SURGICAL PATHOLOGY

## 2020-11-14 ENCOUNTER — Ambulatory Visit: Payer: BLUE CROSS/BLUE SHIELD | Attending: Orthopedic Surgery | Admitting: Physical Therapy

## 2020-11-14 ENCOUNTER — Other Ambulatory Visit: Payer: Self-pay

## 2020-11-14 VITALS — BP 110/78 | HR 80

## 2020-11-14 DIAGNOSIS — M25551 Pain in right hip: Secondary | ICD-10-CM | POA: Insufficient documentation

## 2020-11-14 DIAGNOSIS — R29898 Other symptoms and signs involving the musculoskeletal system: Secondary | ICD-10-CM | POA: Diagnosis present

## 2020-11-14 DIAGNOSIS — Z96641 Presence of right artificial hip joint: Secondary | ICD-10-CM | POA: Insufficient documentation

## 2020-11-14 DIAGNOSIS — M25651 Stiffness of right hip, not elsewhere classified: Secondary | ICD-10-CM | POA: Diagnosis present

## 2020-11-14 NOTE — Therapy (Signed)
[facility] [address] [place], [place], [address] Phone: [phone]   Fax:  [phone]  Physical Therapy Evaluation  Patient Details  Name: Tiernan Suto MRN: [id number] Date of Birth: 1970-04-03 Referring Provider (PT): Dr. Rosita Kea   Encounter Date: 11/14/2020   PT End of Session - 11/14/20 1806     Visit Number 1    Number of Visits 21    Date for PT Re-Evaluation 01/23/21    PT Start Time 0345    PT Stop Time 0430    PT Time Calculation (min) 45 min    Equipment Utilized During Treatment Gait belt    Activity Tolerance Patient tolerated treatment well    Behavior During Therapy [facility] for tasks assessed/performed             Past Medical History:  Diagnosis Date   Arthritis    Back pain    Erectile dysfunction    GERD (gastroesophageal reflux disease)    Heart murmur    as a child   History of kidney stones    History of MRSA infection 2016   left forearm from spider bite   Hypercholesteremia    Hypertension    Sleep apnea     Past Surgical History:  Procedure Laterality Date   CARPOMETACARPAL (CMC) FUSION OF THUMB Right 04/07/2018   Procedure: CARPOMETACARPAL (CMC) ARHTROPLASTY OF THUMB;  Surgeon: Kennedy Bucker, MD;  Location: [facility] ORS;  Service: Orthopedics;  Laterality: Right;   CERVICAL FUSION  11/2016   ELBOW SURGERY Left    FRACTURE SURGERY     KNEE ARTHROSCOPY WITH MEDIAL MENISECTOMY Left 01/13/2018   Procedure: KNEE ARTHROSCOPY WITH MEDIAL MENISECTOMY;  Surgeon: Kennedy Bucker, MD;  Location: [facility] OR[facility];  Service: Orthopedics;  Laterality: Left;   LAPAROSCOPIC GASTRIC BYPASS     SHOULDER SURGERY Left 2017   TONSILLECTOMY     as a child   TOTAL HIP ARTHROPLASTY Right 10/17/2020   Procedure: TOTAL HIP ARTHROPLASTY ANTERIOR APPROACH;  Surgeon: Kennedy Bucker, MD;  Location: [facility];  Service: Orthopedics;  Laterality: Right;    Vitals:   11/14/20 1559  BP: 110/78  Pulse: 80   SpO2: 98%      Subjective Assessment - 11/14/20 1559     Subjective Pt reports that he has increased pain in his right knee and shooting pain across right inner thigh to groin    Pertinent History Right tha 2/2 OA    How long can you sit comfortably? Sitting for prolonged time makes him sick    How long can you stand comfortably? Standing is ok    How long can you walk comfortably? Not much difficulty    Currently in Pain? No/denies    Pain Score 5     Pain Location Hip    Pain Orientation Right    Pain Descriptors / Indicators Aching    Pain Type Chronic pain    Pain Onset More than a month ago    Pain Frequency Intermittent    Aggravating Factors  Standing still and not moving at all                [facility] PT Assessment - 11/14/20 0001       Assessment   Medical Diagnosis Right THA    Referring Provider (PT) Dr. Rosita Kea    Next MD Visit 11/29/20    Prior Therapy Yes to HHPT and Inpatient but no outpatient  Restrictions   Weight Bearing Restrictions No      Balance Screen   Has the patient fallen in the past 6 months No      Home Environment   Living Environment Private residence    Living Arrangements Spouse/significant other    Type of Home House    Home Access Stairs to enter    Entrance Stairs-Number of Steps 2    Entrance Stairs-Rails None   Uses columns   Home Layout Multi-level    Alternate Level Stairs-Number of Steps 6    Alternate Level Stairs-Rails Left      Prior Function   Level of Independence Independent      Cognition   Overall Cognitive Status Within Functional Limits for tasks assessed              LOW BACK EVALUATION   - Cauda Equina Syndrome: Negative to all  Bladder/bowel dysfunction Saddle anesthesia- numbness and tingling on right side of groin  Sexual dysfunction Possible neurological deficits in the lower limb (motor or sensory loss, reflex change) Negative    Observation: Presents to clinic ambulating with SPC    Hip AROM: R/L            NORMS: Flexion:  80/120           120 deg Extension:  NT              20 deg Abd:           20/40           40 deg ER:               NT            45 deg IR:                NT            45 deg   Hip Strength:        R         L Hip flexion:           4/5       5/5 Hip abduction:      3+/5       5/5 Hip adduction:      NT       NT Hip extension:      NT       NT  Hip ER:                NT       NT  Hip IR:                  NT      NT    - Muscle Length Testing: NT  Thomas test               (+/-) Ober's test               (+/-) Ely's Test     Functional Tests: Squat Single Leg Stance Sit to stand - Able to perform without using UE support   THEREX:  Sidelying IT Band Stretch 2 x 30 sec  Supine Straight Leg Raise 1 x 7  Standing Hip Abduction 1 x 10 with BUE support  Standing Hip Lunge 2 x 60 sec      PT Education - 11/14/20 1606     Education Details form/technique  PT Short Term Goals - 11/14/20 1757       PT SHORT TERM GOAL #1   Title Pt will demonstrate understanding of home exercise plan.    Baseline 11/13/20: NT    Time 2    Period Weeks    Status New    Target Date 11/28/20           HEP includes following exercises:  Access Code: [email or web address] URL: [email or web address] Date: 11/14/2020 Prepared by: Ellin Goodieaniel Gorge Almanza  Exercises Supine Active Straight Leg Raise - 1 x daily - 7 x weekly - 3 sets - 10 reps Sidelying ITB Stretch off Table - 1 x daily - 7 x weekly - 1 sets - 5 reps - 60 hold Standing Hip Abduction AROM - 1 x daily - 7 x weekly - 3 sets - 10 reps Side Lunge Adductor Stretch - 1 x daily - 7 x weekly - 1 sets - 5 reps - 60 hold     PT Long Term Goals - 11/14/20 1758       PT LONG TERM GOAL #1   Title Pt will demonstrate symmetrical hip ROM to improve gait mechanics.    Baseline 11/14/20: Hip Flexion R/L 80/120, Hip Abd R/L 20/40, Hip Ext R/L: NT, Hip IR NT, Hip ER NT    Time  10    Period Weeks    Status New    Target Date 01/23/21      PT LONG TERM GOAL #2   Title Patient will demonstrate symmetrical hip strength to improve mobility.    Baseline 9/8: Hip Flexion R/L 4/5 5/5, Hip Abd R/L 3+/5 5/5, Hip Ext NT, Hip Add NT    Time 10    Period Weeks    Status New    Target Date 01/23/21      PT LONG TERM GOAL #3   Title Patient will have improved function and activity level as evidenced by an increase in FOTO score by 10 points or more.    Baseline 9/8: 47/65    Time 10    Period Weeks    Status New    Target Date 01/23/21      PT LONG TERM GOAL #4   Title Patient will demonstrate symmetrical heel strike and step length without use of AD.    Baseline 9/8: NT    Time 10    Period Weeks    Target Date 01/23/21                    Plan - 11/14/20 1746     Clinical Impression Statement Pt is a 50 yo hispanic male that presents for initial evaluation for a right total hip replacement. He presents with right hip pain, decreased right hip strength and ROM. He will benefit from skilled PT to progress his right hip strength and ROM to return to ambulating with symmetrical steps and without use of AD and to negotiating stairs to safely navigate his home environment.    Personal Factors and Comorbidities Time since onset of injury/illness/exacerbation;Past/Current Experience    Stability/Clinical Decision Making Stable/Uncomplicated    Clinical Decision Making Low    Rehab Potential Good    PT Frequency 2x / week    PT Duration Other (comment)   5   PT Treatment/Interventions Therapeutic activities;Therapeutic exercise;Balance training;Neuromuscular re-education;Moist Heat;Stair training;Gait training;Cryotherapy;Aquatic Therapy;Passive range of motion;Dry needling;Joint Manipulations;Manual techniques;Scar mobilization    PT Next Visit Plan Finish Hip ROM and Strength, Funcitonal measures squat, stairs,  etc. Gait assessment    PT Home Exercise Plan  [email or web address]    Consulted and Agree with Plan of Care Patient             Patient will benefit from skilled therapeutic intervention in order to improve the following deficits and impairments:  Abnormal gait, Pain, Difficulty walking, Decreased strength, Impaired flexibility, Decreased range of motion, Decreased balance, Decreased activity tolerance  Visit Diagnosis: Right hip pain  Weakness of right hip  Stiffness of right hip, not elsewhere classified     Problem List Patient Active Problem List   Diagnosis Date Noted   Status post total hip replacement, right 10/17/2020   Ellin Goodie PT, DPT  11/14/2020, 6:10 PM  [place] [facility] [address] [place], [address] Phone: [phone]   Fax:  [phone]  Name: Kosisochukwu Burningham MRN: [id number] Date of Birth: 06/07/1970

## 2020-11-18 ENCOUNTER — Other Ambulatory Visit: Payer: Self-pay

## 2020-11-18 ENCOUNTER — Ambulatory Visit: Payer: BLUE CROSS/BLUE SHIELD | Admitting: Physical Therapy

## 2020-11-18 DIAGNOSIS — M25551 Pain in right hip: Secondary | ICD-10-CM

## 2020-11-18 DIAGNOSIS — M25651 Stiffness of right hip, not elsewhere classified: Secondary | ICD-10-CM

## 2020-11-18 DIAGNOSIS — R29898 Other symptoms and signs involving the musculoskeletal system: Secondary | ICD-10-CM

## 2020-11-18 NOTE — Therapy (Signed)
[place] [facility] [address], [place], [address] Phone: [phone]   Fax:  [phone]  Physical Therapy Treatment  Patient Details  Name: John James MRN: [id number] Date of Birth: 10-23-1970 Referring Provider (PT): Dr. Rosita Kea   Encounter Date: 11/18/2020   PT End of Session - 11/18/20 1811     Visit Number 2    Number of Visits 21    Date for PT Re-Evaluation 01/23/21    PT Start Time 1800    PT Stop Time 1845    PT Time Calculation (min) 45 min    Equipment Utilized During Treatment Gait belt    Activity Tolerance Patient tolerated treatment well    Behavior During Therapy [facility] for tasks assessed/performed             Past Medical History:  Diagnosis Date   Arthritis    Back pain    Erectile dysfunction    GERD (gastroesophageal reflux disease)    Heart murmur    as a child   History of kidney stones    History of MRSA infection 2016   left forearm from spider bite   Hypercholesteremia    Hypertension    Sleep apnea     Past Surgical History:  Procedure Laterality Date   CARPOMETACARPAL (CMC) FUSION OF THUMB Right 04/07/2018   Procedure: CARPOMETACARPAL (CMC) ARHTROPLASTY OF THUMB;  Surgeon: Kennedy Bucker, MD;  Location: [facility] OR[facility];  Service: Orthopedics;  Laterality: Right;   CERVICAL FUSION  11/2016   ELBOW SURGERY Left    FRACTURE SURGERY     KNEE ARTHROSCOPY WITH MEDIAL MENISECTOMY Left 01/13/2018   Procedure: KNEE ARTHROSCOPY WITH MEDIAL MENISECTOMY;  Surgeon: Kennedy Bucker, MD;  Location: [facility];  Service: Orthopedics;  Laterality: Left;   LAPAROSCOPIC GASTRIC BYPASS     SHOULDER SURGERY Left 2017   TONSILLECTOMY     as a child   TOTAL HIP ARTHROPLASTY Right 10/17/2020   Procedure: TOTAL HIP ARTHROPLASTY ANTERIOR APPROACH;  Surgeon: Kennedy Bucker, MD;  Location: [facility];  Service: Orthopedics;  Laterality: Right;    There were no vitals filed for this visit.    Subjective Assessment - 11/18/20 1809     Subjective Pt reports that he has been doing well and that he starts to have right hip pain towards end of day.    Pertinent History Right tha 2/2 OA    How long can you sit comfortably? Sitting for prolonged time makes him sick    How long can you stand comfortably? Standing is ok    How long can you walk comfortably? Not much difficulty    Currently in Pain? Yes    Pain Score 5     Pain Location Hip    Pain Orientation Right    Pain Descriptors / Indicators Aching    Pain Type Chronic pain    Pain Onset More than a month ago            PHYSICAL PERFORMANCE   Hip AROM: R/L            NORMS: Flexion:  80/120           120 deg Extension: 20/20              20 deg Abd:           20/40           40 deg ER:  30/45            45 deg IR:              30/45            45 deg    Hip Strength:        R         L Hip flexion:           4/5       5/5 Hip abduction:      3+/5     4/5 Hip adduction:      NT       NT Hip extension:      4/5       4/5   Hip ER:                4/5       4/5  Hip IR:                  4/5       4/5  THEREX:  Recumbent bicycle Level 2 resistance 10 min   Forward Step Ups/Downs 2 x 10 Stepping on Right and Stepping Down with Left  Lateral Step Ups 2 x 10 Stepping on Right and Stepping Down with Left   Supine Adduction 4 x 30 sec    Updated HEP and educated patient on changes to exercises and addition of new exercises that included forward and lateral step up and downs.      PT Education - 11/18/20 1810     Education Details form/technique for appropriate exercise    Person(s) Educated Patient    Methods Explanation    Comprehension Verbalized understanding;Returned demonstration;Verbal cues required              PT Short Term Goals - 11/18/20 1811       PT SHORT TERM GOAL #1   Title Pt will demonstrate understanding of home exercise plan.    Baseline 11/13/20: NT    Time 2    Period  Weeks    Status New    Target Date 11/28/20               PT Long Term Goals - 11/18/20 1812       PT LONG TERM GOAL #1   Title Pt will demonstrate symmetrical hip ROM to improve gait mechanics.    Baseline 11/14/20: Hip Flexion R/L 80/120, Hip Abd R/L 20/40, Hip Ext R/L: 30/45, Hip IR 30/45    Time 10    Period Weeks    Status New      PT LONG TERM GOAL #2   Title Patient will demonstrate symmetrical hip strength to improve mobility.    Baseline 9/8: Hip Flexion R/L 4/5 5/5, Hip Abd R/L 3+/5 5/5, Hip Ext NT, Hip Add NT    Time 10    Period Weeks    Status New      PT LONG TERM GOAL #3   Title Patient will have improved function and activity level as evidenced by an increase in FOTO score by 10 points or more.    Baseline 9/8: 47/65    Time 10    Period Weeks    Status New      PT LONG TERM GOAL #4   Title Patient will demonstrate symmetrical heel strike and step length without use of AD.    Baseline 9/8: NT    Time 10  Period Weeks            HEP includes the following   Access Code: [email or web address] URL: [email or web address] Date: 11/18/2020 Prepared by: Ellin Goodie  Exercises Sidelying ITB Stretch off Table - 1 x daily - 7 x weekly - 1 sets - 5 reps - 60 hold Supine Active Straight Leg Raise - 1 x daily - 7 x weekly - 3 sets - 10 reps Standing Hip Abduction AROM - 1 x daily - 7 x weekly - 3 sets - 10 reps Side Lunge Adductor Stretch - 1 x daily - 7 x weekly - 1 sets - 5 reps - 60 hold Step Up - 1 x daily - 3 x weekly - 2 sets - 10 reps Lateral Step Up - 1 x daily - 3 x weekly - 2 sets - 10 reps Supine Adduction Stretch 4 x 30 sec        Plan - 11/18/20 1813     Clinical Impression Statement Pt presents for f/u for right THA s/p 4 weeks. Pt is currently in weeks 3-6 intermediate phase of protocol and he was able to tolerate forward and lateral step ups without an increase in his pain. He will cotinue to benefit from skilled PT to  increase his right hip strength and ROM to return to a symmetrical gait and recreational activities such as playing softball.    Personal Factors and Comorbidities Time since onset of injury/illness/exacerbation;Past/Current Experience    Stability/Clinical Decision Making Stable/Uncomplicated    Rehab Potential Good    PT Frequency 2x / week    PT Duration Other (comment)   5   PT Treatment/Interventions Therapeutic activities;Therapeutic exercise;Balance training;Neuromuscular re-education;Moist Heat;Stair training;Gait training;Cryotherapy;Aquatic Therapy;Passive range of motion;Dry needling;Joint Manipulations;Manual techniques;Scar mobilization    PT Next Visit Plan Gait analysis. Stairs. Progress hip strengthening exercises. Balance exercises    PT Home Exercise Plan [email or web address]    Consulted and Agree with Plan of Care Patient             Patient will benefit from skilled therapeutic intervention in order to improve the following deficits and impairments:  Abnormal gait, Pain, Difficulty walking, Decreased strength, Impaired flexibility, Decreased range of motion, Decreased balance, Decreased activity tolerance  Visit Diagnosis: Right hip pain  Weakness of right hip  Stiffness of right hip, not elsewhere classified     Problem List Patient Active Problem List   Diagnosis Date Noted   Status post total hip replacement, right 10/17/2020   Ellin Goodie PT, DPT  11/18/2020, 9:52 PM  [place] [facility] [address] [place], [address] Phone: [phone]   Fax:  [phone]  Name: John James MRN: [id number] Date of Birth: 05-Sep-1970

## 2020-11-19 LAB — COLOGUARD

## 2020-11-21 ENCOUNTER — Ambulatory Visit: Payer: BLUE CROSS/BLUE SHIELD | Admitting: Physical Therapy

## 2020-11-21 DIAGNOSIS — R29898 Other symptoms and signs involving the musculoskeletal system: Secondary | ICD-10-CM

## 2020-11-21 DIAGNOSIS — M25651 Stiffness of right hip, not elsewhere classified: Secondary | ICD-10-CM

## 2020-11-21 DIAGNOSIS — M25551 Pain in right hip: Secondary | ICD-10-CM

## 2020-11-21 NOTE — Therapy (Addendum)
[facility] [address], [place], [address] Phone: [phone]   Fax:  [phone]  Physical Therapy Treatment  Patient Details  Name: Van Seymore MRN: [id number] Date of Birth: 1971-02-27 Referring Provider (PT): Dr. Rosita Kea   Encounter Date: 11/21/2020   PT End of Session - 11/21/20 0850     Visit Number 3    Number of Visits 21    Date for PT Re-Evaluation 01/23/21    PT Start Time 0845    PT Stop Time 0930    PT Time Calculation (min) 45 min    Equipment Utilized During Treatment Gait belt    Activity Tolerance Patient tolerated treatment well    Behavior During Therapy [facility] for tasks assessed/performed             Past Medical History:  Diagnosis Date   Arthritis    Back pain    Erectile dysfunction    GERD (gastroesophageal reflux disease)    Heart murmur    as a child   History of kidney stones    History of MRSA infection 2016   left forearm from spider bite   Hypercholesteremia    Hypertension    Sleep apnea     Past Surgical History:  Procedure Laterality Date   CARPOMETACARPAL (CMC) FUSION OF THUMB Right 04/07/2018   Procedure: CARPOMETACARPAL (CMC) ARHTROPLASTY OF THUMB;  Surgeon: Kennedy Bucker, MD;  Location: [facility] ORS;  Service: Orthopedics;  Laterality: Right;   CERVICAL FUSION  11/2016   ELBOW SURGERY Left    FRACTURE SURGERY     KNEE ARTHROSCOPY WITH MEDIAL MENISECTOMY Left 01/13/2018   Procedure: KNEE ARTHROSCOPY WITH MEDIAL MENISECTOMY;  Surgeon: Kennedy Bucker, MD;  Location: [facility];  Service: Orthopedics;  Laterality: Left;   LAPAROSCOPIC GASTRIC BYPASS     SHOULDER SURGERY Left 2017   TONSILLECTOMY     as a child   TOTAL HIP ARTHROPLASTY Right 10/17/2020   Procedure: TOTAL HIP ARTHROPLASTY ANTERIOR APPROACH;  Surgeon: Kennedy Bucker, MD;  Location: [facility];  Service: Orthopedics;  Laterality: Right;    There were no vitals filed for this visit.    Subjective Assessment - 11/21/20 0848     Subjective Pt states that he is experiencing increased pain from the exercises especially inside his right groin area.    Pertinent History Right tha 2/2 OA    How long can you sit comfortably? Sitting for prolonged time makes him sick    How long can you stand comfortably? Standing is ok    How long can you walk comfortably? Not much difficulty    Currently in Pain? Yes    Pain Score 7     Pain Location Hip    Pain Orientation Right    Pain Descriptors / Indicators Aching    Pain Type Chronic pain    Pain Onset More than a month ago            EVAL:  Gait with use of SPC: Decreased stance time on RLE.   Stairs: BUE use of 2 rails and alternating steps   THEREX:   Recumbent Cycle Seat at 12 and Resistance at 7  -Duration 10 min   Sidelying IT Band Stretch 3 x 30 sec   NMR  SLS Eyes Open 3 x 30 sec  SLS Eyes Closed  3 x 30 sec  SLS Eyes Open on Foam 3 x 30 sec    Updated HEP and  educated patient on changes to exercises and addition of new exercise includes single leg stance with eyes closed.      PT Education - 11/21/20 0849     Education Details form/technique for appropriate exercise    Person(s) Educated Patient    Methods Explanation;Demonstration;Verbal cues    Comprehension Verbalized understanding;Verbal cues required;Tactile cues required;Returned demonstration              PT Short Term Goals - 11/21/20 0851       PT SHORT TERM GOAL #1   Title Pt will demonstrate understanding of home exercise plan.    Baseline 11/13/20: NT    Time 2    Period Weeks    Status New    Target Date 11/28/20               PT Long Term Goals - 11/21/20 0851       PT LONG TERM GOAL #1   Title Pt will demonstrate symmetrical hip ROM to improve gait mechanics.    Baseline 11/14/20: Hip Flexion R/L 80/120, Hip Abd R/L 20/40, Hip Ext R/L: 30/45, Hip IR 30/45    Time 10    Period Weeks    Status New      PT LONG  TERM GOAL #2   Title Patient will demonstrate symmetrical hip strength to improve mobility.    Baseline 9/8: Hip Flexion R/L 4/5 5/5, Hip Abd R/L 3+/5 5/5, Hip Ext NT, Hip Add NT    Time 10    Period Weeks    Status New      PT LONG TERM GOAL #3   Title Patient will have improved function and activity level as evidenced by an increase in FOTO score by 10 points or more.    Baseline 9/8: 47/65    Time 10    Period Weeks    Status New      PT LONG TERM GOAL #4   Title Patient will demonstrate symmetrical heel strike and step length without use of AD.    Baseline 9/8: NT    Time 10    Period Weeks                   Plan - 11/21/20 7322     Clinical Impression Statement Pt presents for f/u for right THA s/p 4 weeks. He continues to make progress with ability to maintain single leg stance on RLE with eyes open and closed. PT recommends that pt continue scar massage because of his ongoing radiating groin pain.  He will cotinue to benefit from skilled PT to increase his right hip strength and ROM to return to a symmetrical gait and recreational activities such as playing softball.    Personal Factors and Comorbidities Time since onset of injury/illness/exacerbation;Past/Current Experience    Stability/Clinical Decision Making Stable/Uncomplicated    Rehab Potential Good    PT Frequency 2x / week    PT Duration Other (comment)   5   PT Treatment/Interventions Therapeutic activities;Therapeutic exercise;Balance training;Neuromuscular re-education;Moist Heat;Stair training;Gait training;Cryotherapy;Aquatic Therapy;Passive range of motion;Dry needling;Joint Manipulations;Manual techniques;Scar mobilization    PT Next Visit Plan Progress standing hip strengthening exercises and gait exercies    PT Home Exercise Plan [email or web address]    Consulted and Agree with Plan of Care Patient            HEP includes:  Access Code: [email or web address] URL: [email or web address] Date:  11/21/2020 Prepared by: Ellin Goodie  Exercises Sidelying ITB Stretch off Table -  1 x daily - 7 x weekly - 1 sets - 5 reps - 60 hold Supine Active Straight Leg Raise - 1 x daily - 7 x weekly - 3 sets - 10 reps Standing Hip Abduction AROM - 1 x daily - 7 x weekly - 3 sets - 10 reps Side Lunge Adductor Stretch - 1 x daily - 7 x weekly - 1 sets - 5 reps - 60 hold Step Up - 1 x daily - 3 x weekly - 2 sets - 10 reps Lateral Step Up - 1 x daily - 3 x weekly - 2 sets - 10 reps Single Leg Stance - 1 x daily - 7 x weekly - 1 sets - 5 reps - 30 hold  Patient will benefit from skilled therapeutic intervention in order to improve the following deficits and impairments:  Abnormal gait, Pain, Difficulty walking, Decreased strength, Impaired flexibility, Decreased range of motion, Decreased balance, Decreased activity tolerance  Visit Diagnosis: Right hip pain  Weakness of right hip  Stiffness of right hip, not elsewhere classified     Problem List Patient Active Problem List   Diagnosis Date Noted   Status post total hip replacement, right 10/17/2020   Ellin Goodie PT, DPT  11/21/2020, 9:30 AM  [place] [facility] [address], [place], [address] Phone: [phone]   Fax:  [phone]  Name: Zidan Helget MRN: [id number] Date of Birth: May 12, 1970

## 2020-11-28 ENCOUNTER — Ambulatory Visit: Payer: BLUE CROSS/BLUE SHIELD | Admitting: Physical Therapy

## 2020-11-28 DIAGNOSIS — M25551 Pain in right hip: Secondary | ICD-10-CM | POA: Diagnosis not present

## 2020-11-28 DIAGNOSIS — M25651 Stiffness of right hip, not elsewhere classified: Secondary | ICD-10-CM

## 2020-11-28 DIAGNOSIS — R29898 Other symptoms and signs involving the musculoskeletal system: Secondary | ICD-10-CM

## 2020-11-28 NOTE — Therapy (Addendum)
[place] [facility] [address] [place], [place], [address] Phone: [phone]   Fax:  [phone]  Physical Therapy Treatment  Patient Details  Name: John James MRN: [id number] Date of Birth: 1971-02-28 Referring Provider (PT): Dr. Rosita Kea   Encounter Date: 11/28/2020   PT End of Session - 11/28/20 0805     Visit Number 4    Number of Visits 21    Date for PT Re-Evaluation 01/23/21    PT Start Time 0800    PT Stop Time 0845    PT Time Calculation (min) 45 min    Equipment Utilized During Treatment Gait belt    Activity Tolerance Patient tolerated treatment well    Behavior During Therapy [facility] for tasks assessed/performed             Past Medical History:  Diagnosis Date   Arthritis    Back pain    Erectile dysfunction    GERD (gastroesophageal reflux disease)    Heart murmur    as a child   History of kidney stones    History of MRSA infection 2016   left forearm from spider bite   Hypercholesteremia    Hypertension    Sleep apnea     Past Surgical History:  Procedure Laterality Date   CARPOMETACARPAL (CMC) FUSION OF THUMB Right 04/07/2018   Procedure: CARPOMETACARPAL (CMC) ARHTROPLASTY OF THUMB;  Surgeon: Kennedy Bucker, MD;  Location: [facility];  Service: Orthopedics;  Laterality: Right;   CERVICAL FUSION  11/2016   ELBOW SURGERY Left    FRACTURE SURGERY     KNEE ARTHROSCOPY WITH MEDIAL MENISECTOMY Left 01/13/2018   Procedure: KNEE ARTHROSCOPY WITH MEDIAL MENISECTOMY;  Surgeon: Kennedy Bucker, MD;  Location: [facility];  Service: Orthopedics;  Laterality: Left;   LAPAROSCOPIC GASTRIC BYPASS     SHOULDER SURGERY Left 2017   TONSILLECTOMY     as a child   TOTAL HIP ARTHROPLASTY Right 10/17/2020   Procedure: TOTAL HIP ARTHROPLASTY ANTERIOR APPROACH;  Surgeon: Kennedy Bucker, MD;  Location: [facility];  Service: Orthopedics;  Laterality: Right;    There were no vitals filed for this visit.    Subjective Assessment - 11/28/20 0803     Subjective Pt reports that his groin feels painful and stiff in morning and eases up over time.    Pertinent History Right tha 2/2 OA    How long can you sit comfortably? Sitting for prolonged time makes him sick    How long can you stand comfortably? Standing is ok    How long can you walk comfortably? Not much difficulty    Currently in Pain? No/denies    Pain Onset More than a month ago             THEREX:  Recumbent Bicycle Level 3 10 min   Mini-squats to 90 degree hip flexion 1 x 10  -Pt notes increased pain in pes anserianus    Wall hip abduction bumps 3 x 10  -min VC to use hip instead of leg   Step Overs using 4 inch step 2 x 10   Standing HS curls with #3 AW with 1 UE support 3 x 10    Updated HEP and educated patient on changes to exercises and addition of new exercises that include standing hs curls, mini-squats, and wall hip abduction bumps.             PT Education - 11/28/20 2440  Education Details form/technique for appropriate exercise    Person(s) Educated Patient    Methods Explanation    Comprehension Verbalized understanding;Verbal cues required;Returned demonstration              PT Short Term Goals - 11/28/20 0810       PT SHORT TERM GOAL #1   Title Pt will demonstrate understanding of home exercise plan.    Baseline 11/13/20: NT    Time 2    Period Weeks    Status New    Target Date 11/28/20               PT Long Term Goals - 11/28/20 0810       PT LONG TERM GOAL #1   Title Pt will demonstrate symmetrical hip ROM to improve gait mechanics.    Baseline 11/14/20: Hip Flexion R/L 80/120, Hip Abd R/L 20/40, Hip Ext R/L: 30/45, Hip IR 30/45    Time 10    Period Weeks    Status New      PT LONG TERM GOAL #2   Title Patient will demonstrate symmetrical hip strength to improve mobility.    Baseline 9/8: Hip Flexion R/L 4/5 5/5, Hip Abd R/L 3+/5 5/5, Hip Ext NT, Hip Add NT     Time 10    Period Weeks    Status New      PT LONG TERM GOAL #3   Title Patient will have improved function and activity level as evidenced by an increase in FOTO score by 10 points or more.    Baseline 9/8: 47/65    Time 10    Period Weeks    Status New      PT LONG TERM GOAL #4   Title Patient will demonstrate symmetrical heel strike and step length without use of AD.    Baseline 9/8: NT    Time 10    Period Weeks                   Plan - 11/28/20 5859     Clinical Impression Statement Pt presents for f/u for right THA s/p 5 weeks. He presents with increased hip strength with ability to complete step up and step downs with level hips and standing knee flexion with 1 UE support. He was able to complete all exercises without an increase in his right hip pain with exception of increase in right pes anserianus during squat. He does presents with decreased stance time on RLE during gait likely due to hip weakness and he will continue to benefit from skilled PT to be able to perform symmetrical step length and stance time.    Personal Factors and Comorbidities Time since onset of injury/illness/exacerbation;Past/Current Experience    Stability/Clinical Decision Making Stable/Uncomplicated    Rehab Potential Good    PT Frequency 2x / week    PT Duration Other (comment)   5   PT Treatment/Interventions Therapeutic activities;Therapeutic exercise;Balance training;Neuromuscular re-education;Moist Heat;Stair training;Gait training;Cryotherapy;Aquatic Therapy;Passive range of motion;Dry needling;Joint Manipulations;Manual techniques;Scar mobilization    PT Next Visit Plan Progress standing hip strengthening exercises and gait exercies    PT Home Exercise Plan [email or web address]    Consulted and Agree with Plan of Care Patient            HEP includes the following:  Access Code: [email or web address] URL: [email or web address] Date: 11/28/2020 Prepared by: Ellin Goodie  Exercises Sidelying ITB Stretch off Table - 1 x daily - 7 x weekly - 1  sets - 5 reps - 60 hold Standing Hip Abduction AROM - 1 x daily - 7 x weekly - 3 sets - 10 reps Side Lunge Adductor Stretch - 1 x daily - 7 x weekly - 1 sets - 5 reps - 60 hold Step Up - 1 x daily - 3 x weekly - 3 sets - 10 reps Lateral Step Up - 1 x daily - 3 x weekly - 2 sets - 10 reps Single Leg Stance - 1 x daily - 7 x weekly - 1 sets - 5 reps - 30 hold Mini Squat - 1 x daily - 7 x weekly - 3 sets - 10 reps Standing Alternating Knee Flexion - 1 x daily - 7 x weekly - 3 sets - 10 reps Wall Hip Abduction Taps 3 x 10 x 3 days per week   Patient will benefit from skilled therapeutic intervention in order to improve the following deficits and impairments:  Abnormal gait, Pain, Difficulty walking, Decreased strength, Impaired flexibility, Decreased range of motion, Decreased balance, Decreased activity tolerance  Visit Diagnosis: Right hip pain  Weakness of right hip  Stiffness of right hip, not elsewhere classified     Problem List Patient Active Problem List   Diagnosis Date Noted   Status post total hip replacement, right 10/17/2020   Ellin Goodie PT, DPT  11/28/2020, 9:41 AM  [facility] [address] [place], [address] Phone: [phone]   Fax:  [phone]  Name: John James MRN: [id number] Date of Birth: 07/21/1970

## 2020-12-02 LAB — COLOGUARD: COLOGUARD: NEGATIVE

## 2020-12-03 ENCOUNTER — Ambulatory Visit: Payer: BLUE CROSS/BLUE SHIELD | Admitting: Physical Therapy

## 2020-12-03 DIAGNOSIS — M25651 Stiffness of right hip, not elsewhere classified: Secondary | ICD-10-CM

## 2020-12-03 DIAGNOSIS — M25551 Pain in right hip: Secondary | ICD-10-CM

## 2020-12-03 DIAGNOSIS — R29898 Other symptoms and signs involving the musculoskeletal system: Secondary | ICD-10-CM

## 2020-12-03 NOTE — Therapy (Signed)
[place] [facility] [address] [place], [place], [address] Phone: [phone]   Fax:  [phone]  Physical Therapy Treatment  Patient Details  Name: John James MRN: [id number] Date of Birth: Mar 28, 1970 Referring Provider (PT): Dr. Rosita Kea   Encounter Date: 12/03/2020   PT End of Session - 12/03/20 0810     Visit Number 5    Number of Visits 21    Date for PT Re-Evaluation 01/23/21    PT Start Time 0800    PT Stop Time 0845    PT Time Calculation (min) 45 min    Equipment Utilized During Treatment Gait belt    Activity Tolerance Patient tolerated treatment well    Behavior During Therapy [facility] for tasks assessed/performed             Past Medical History:  Diagnosis Date   Arthritis    Back pain    Erectile dysfunction    GERD (gastroesophageal reflux disease)    Heart murmur    as a child   History of kidney stones    History of MRSA infection 2016   left forearm from spider bite   Hypercholesteremia    Hypertension    Sleep apnea     Past Surgical History:  Procedure Laterality Date   CARPOMETACARPAL (CMC) FUSION OF THUMB Right 04/07/2018   Procedure: CARPOMETACARPAL (CMC) ARHTROPLASTY OF THUMB;  Surgeon: Kennedy Bucker, MD;  Location: [facility];  Service: Orthopedics;  Laterality: Right;   CERVICAL FUSION  11/2016   ELBOW SURGERY Left    FRACTURE SURGERY     KNEE ARTHROSCOPY WITH MEDIAL MENISECTOMY Left 01/13/2018   Procedure: KNEE ARTHROSCOPY WITH MEDIAL MENISECTOMY;  Surgeon: Kennedy Bucker, MD;  Location: [facility];  Service: Orthopedics;  Laterality: Left;   LAPAROSCOPIC GASTRIC BYPASS     SHOULDER SURGERY Left 2017   TONSILLECTOMY     as a child   TOTAL HIP ARTHROPLASTY Right 10/17/2020   Procedure: TOTAL HIP ARTHROPLASTY ANTERIOR APPROACH;  Surgeon: Kennedy Bucker, MD;  Location: [facility];  Service: Orthopedics;  Laterality: Right;    There were no vitals filed for this visit.    Subjective Assessment - 12/03/20 0805     Subjective Pt reports that he recently he went to orthopedist apt and he notes that he is making progress. He notes that he is still having discomfort with with groin and notices a shorter step on his left.    Pertinent History Right tha 2/2 OA    How long can you sit comfortably? Sitting for prolonged time makes him sick    How long can you stand comfortably? Standing is ok    How long can you walk comfortably? Not much difficulty    Currently in Pain? Yes    Pain Score 4     Pain Location Hip    Pain Orientation Right    Pain Descriptors / Indicators Aching    Pain Type Chronic pain    Pain Onset More than a month ago            THEREX:  Recumbent Bicycle Level 3 10 min    SLS 2 x 30 sec  SLS on RLE 4 [place] Taps x 10  SLS on foam 5 x 30 sec   Forward Lunges 3 x 10 with #10 DB  -min VC to maintain upright posture -Pain in right quad and in knee from squatting  Squats 90 degrees 3 x 10  Prone Quad Stretch of RLE 5 x 30 sec    Updated HEP and educated patient on changes to exercises and addition of new exercises that include forward lunges and squats.      PT Education - 12/03/20 0809     Education Details form/technique for appropriate exercise    Person(s) Educated Patient    Methods Explanation;Demonstration;Verbal cues;Handout    Comprehension Verbalized understanding;Returned demonstration;Verbal cues required              PT Short Term Goals - 12/03/20 0811       PT SHORT TERM GOAL #1   Title Pt will demonstrate understanding of home exercise plan.    Baseline 11/13/20: NT    Time 2    Period Weeks    Status Achieved    Target Date 11/28/20               PT Long Term Goals - 12/03/20 0811       PT LONG TERM GOAL #1   Title Pt will demonstrate symmetrical hip ROM to improve gait mechanics.    Baseline 11/14/20: Hip Flexion R/L 80/120, Hip Abd R/L 20/40, Hip Ext R/L: 30/45, Hip IR 30/45    Time 10     Period Weeks    Status New      PT LONG TERM GOAL #2   Title Patient will demonstrate symmetrical hip strength to improve mobility.    Baseline 9/8: Hip Flexion R/L 4/5 5/5, Hip Abd R/L 3+/5 5/5, Hip Ext NT, Hip Add NT    Time 10    Period Weeks    Status New      PT LONG TERM GOAL #3   Title Patient will have improved function and activity level as evidenced by an increase in FOTO score by 10 points or more.    Baseline 9/8: 47/65    Time 10    Period Weeks    Status New      PT LONG TERM GOAL #4   Title Patient will demonstrate symmetrical heel strike and step length without use of AD.    Baseline 9/8: NT    Time 10    Period Weeks                   Plan - 12/03/20 9892     Clinical Impression Statement Pt presents for f/u for right THA s/p 6 weeks. He has now entered outpatient intermediate phase and shows improved balance and strength on RLE with abilit  he will continue to benefit from skilled PT to be able to perform symmetrical step length and stance time. He will continue to benefit from skilled PT to be able to perform symmetrical step length and stance time and to return to running for softball.    Personal Factors and Comorbidities Time since onset of injury/illness/exacerbation;Past/Current Experience    Stability/Clinical Decision Making Stable/Uncomplicated    Rehab Potential Good    PT Frequency 2x / week    PT Duration Other (comment)   5   PT Treatment/Interventions Therapeutic activities;Therapeutic exercise;Balance training;Neuromuscular re-education;Moist Heat;Stair training;Gait training;Cryotherapy;Aquatic Therapy;Passive range of motion;Dry needling;Joint Manipulations;Manual techniques;Scar mobilization    PT Next Visit Plan Gait training on treadmill, lateral and forward step downs. Address putting on shoes in sitting.    PT Home Exercise Plan [email or web address]    Consulted and Agree with Plan of Care Patient            HEP includes the  following:  Access Code: [email or web address] URL: [email or web address] Date: 12/03/2020 Prepared by: Ellin Goodie  Exercises Sidelying ITB Stretch off Table - 1 x daily - 7 x weekly - 1 sets - 5 reps - 60 hold Prone Quadriceps Stretch with Strap - 1 x daily - 7 x weekly - 1 sets - 5 reps - 30 hold Standing Hip Abduction AROM - 1 x daily - 7 x weekly - 3 sets - 10 reps Side Lunge Adductor Stretch - 1 x daily - 7 x weekly - 1 sets - 5 reps - 60 hold Step Up - 1 x daily - 3 x weekly - 3 sets - 10 reps Lateral Step Up - 1 x daily - 3 x weekly - 2 sets - 10 reps Single Leg Stance - 1 x daily - 7 x weekly - 1 sets - 5 reps - 30 hold Standing Alternating Knee Flexion - 1 x daily - 7 x weekly - 3 sets - 10 reps Standard Lunge - 1 x daily - 3 x weekly - 3 sets - 10 reps Squat - 1 x daily - 3 x weekly - 2 sets - 10 reps   Patient will benefit from skilled therapeutic intervention in order to improve the following deficits and impairments:  Abnormal gait, Pain, Difficulty walking, Decreased strength, Impaired flexibility, Decreased range of motion, Decreased balance, Decreased activity tolerance  Visit Diagnosis: Right hip pain  Weakness of right hip  Stiffness of right hip, not elsewhere classified     Problem List Patient Active Problem List   Diagnosis Date Noted   Status post total hip replacement, right 10/17/2020   Ellin Goodie PT, DPT  12/03/2020, 9:52 AM  [facility] [address] [place], [address] Phone: [phone]   Fax:  [phone]  Name: Luisdaniel Kenton MRN: [id number] Date of Birth: 1970/12/28

## 2020-12-05 ENCOUNTER — Ambulatory Visit: Payer: BLUE CROSS/BLUE SHIELD | Admitting: Physical Therapy

## 2020-12-09 ENCOUNTER — Ambulatory Visit: Payer: BLUE CROSS/BLUE SHIELD | Attending: Orthopedic Surgery

## 2020-12-09 DIAGNOSIS — M25551 Pain in right hip: Secondary | ICD-10-CM | POA: Insufficient documentation

## 2020-12-09 DIAGNOSIS — M25651 Stiffness of right hip, not elsewhere classified: Secondary | ICD-10-CM | POA: Diagnosis present

## 2020-12-09 DIAGNOSIS — Z96641 Presence of right artificial hip joint: Secondary | ICD-10-CM | POA: Insufficient documentation

## 2020-12-09 DIAGNOSIS — R29898 Other symptoms and signs involving the musculoskeletal system: Secondary | ICD-10-CM | POA: Insufficient documentation

## 2020-12-09 NOTE — Therapy (Signed)
[facility] [address] [place], [address] Phone: [phone]   Fax:  [phone]  Physical Therapy Treatment  Patient Details  Name: John James MRN: [id number] Date of Birth: 1971-01-28 Referring Provider (PT): Dr. Rosita Kea   Encounter Date: 12/09/2020   PT End of Session - 12/09/20 1205     Visit Number 6    Number of Visits 21    Date for PT Re-Evaluation 01/23/21    Authorization Type [employer];    PT Start Time 1155    PT Stop Time 1225    PT Time Calculation (min) 30 min    Equipment Utilized During Treatment Gait belt    Activity Tolerance Patient tolerated treatment well    Behavior During Therapy WFL for tasks assessed/performed             Past Medical History:  Diagnosis Date   Arthritis    Back pain    Erectile dysfunction    GERD (gastroesophageal reflux disease)    Heart murmur    as a child   History of kidney stones    History of MRSA infection 2016   left forearm from spider bite   Hypercholesteremia    Hypertension    Sleep apnea     Past Surgical History:  Procedure Laterality Date   CARPOMETACARPAL (CMC) FUSION OF THUMB Right 04/07/2018   Procedure: CARPOMETACARPAL (CMC) ARHTROPLASTY OF THUMB;  Surgeon: Kennedy Bucker, MD;  Location: [facility];  Service: Orthopedics;  Laterality: Right;   CERVICAL FUSION  11/2016   ELBOW SURGERY Left    FRACTURE SURGERY     KNEE ARTHROSCOPY WITH MEDIAL MENISECTOMY Left 01/13/2018   Procedure: KNEE ARTHROSCOPY WITH MEDIAL MENISECTOMY;  Surgeon: Kennedy Bucker, MD;  Location: [facility];  Service: Orthopedics;  Laterality: Left;   LAPAROSCOPIC GASTRIC BYPASS     SHOULDER SURGERY Left 2017   TONSILLECTOMY     as a child   TOTAL HIP ARTHROPLASTY Right 10/17/2020   Procedure: TOTAL HIP ARTHROPLASTY ANTERIOR APPROACH;  Surgeon: Kennedy Bucker, MD;  Location: [facility];  Service: Orthopedics;  Laterality: Right;    There were no vitals  filed for this visit.   Subjective Assessment - 12/09/20 1202     Subjective Pt doing ok today, still fairly stiff and sore with prolonged sitting. He takes breaks for standing and moblity during commercials.    Pertinent History Right tha 2/2 OA    Currently in Pain? Yes    Pain Score 3     Pain Location --   groin, Right knee            -WARMUP and AA/ROM on recumbent bike, seat 15 level 2 x6 minutes  -sustained release to adductor longus -hooklying groin stretch, 2 variations without success -Supine RLE abducted stretch 2x30sec -sustained release of vastus lateralis -RLE SLR at end-range external rotation 2x10 c min-modA (verbal confirmation of psoas targetting)  -glute max bridge1x15 (end range hip/knee flexion wide stance) -sumo squat STS from plinth x15      PT Education - 12/09/20 1205     Education Details Pt educated on conitnued mobility strategies.    Person(s) Educated Patient    Methods Explanation    Comprehension Verbalized understanding              PT Short Term Goals - 12/03/20 0811       PT SHORT TERM GOAL #1   Title Pt will demonstrate understanding of  home exercise plan.    Baseline 11/13/20: NT    Time 2    Period Weeks    Status Achieved    Target Date 11/28/20               PT Long Term Goals - 12/03/20 0811       PT LONG TERM GOAL #1   Title Pt will demonstrate symmetrical hip ROM to improve gait mechanics.    Baseline 11/14/20: Hip Flexion R/L 80/120, Hip Abd R/L 20/40, Hip Ext R/L: 30/45, Hip IR 30/45    Time 10    Period Weeks    Status New      PT LONG TERM GOAL #2   Title Patient will demonstrate symmetrical hip strength to improve mobility.    Baseline 9/8: Hip Flexion R/L 4/5 5/5, Hip Abd R/L 3+/5 5/5, Hip Ext NT, Hip Add NT    Time 10    Period Weeks    Status New      PT LONG TERM GOAL #3   Title Patient will have improved function and activity level as evidenced by an increase in FOTO score by 10 points or  more.    Baseline 9/8: 47/65    Time 10    Period Weeks    Status New      PT LONG TERM GOAL #4   Title Patient will demonstrate symmetrical heel strike and step length without use of AD.    Baseline 9/8: NT    Time 10    Period Weeks                   Plan - 12/09/20 1301     Clinical Impression Statement Continued with current plan of care as laid out in evaluation and recent prior sessions. Pt remains motivated to advance progress toward goals. Rest breaks provided as needed, pt quick to ask when needed. Author maintains all interventions within appropriate level of intensity as not to purposefully exacerbate pain. Pt does require varying levels of assistance and cuing for completion of exercises for correct form and sometimes due to pain/weakness. Spent time on myofascial work to address active trigger points and myofascial pain. No updates to HEP this date.     Personal Factors and Comorbidities Time since onset of injury/illness/exacerbation;Past/Current Experience    Stability/Clinical Decision Making Stable/Uncomplicated    Clinical Decision Making Low    Rehab Potential Good    PT Frequency 2x / week    PT Duration Other (comment)   5 weeks   PT Treatment/Interventions Therapeutic activities;Therapeutic exercise;Balance training;Neuromuscular re-education;Moist Heat;Stair training;Gait training;Cryotherapy;Aquatic Therapy;Passive range of motion;Dry needling;Joint Manipulations;Manual techniques;Scar mobilization    PT Next Visit Plan Gait training on treadmill, lateral and forward step downs. Address putting on shoes in sitting.    PT Home Exercise Plan [id number]    Consulted and Agree with Plan of Care Patient             Patient will benefit from skilled therapeutic intervention in order to improve the following deficits and impairments:  Abnormal gait, Pain, Difficulty walking, Decreased strength, Impaired flexibility, Decreased range of motion, Decreased  balance, Decreased activity tolerance  Visit Diagnosis: Right hip pain  Weakness of right hip  Stiffness of right hip, not elsewhere classified     Problem List Patient Active Problem List   Diagnosis Date Noted   Status post total hip replacement, right 10/17/2020   1:08 PM, 12/09/20 Rosamaria Lints, PT, DPT Physical  Therapist - [place] [phone] (Office)   [place] C, PT 12/09/2020, 1:03 PM  [facility] PHYSICAL AND SPORTS MEDICINE [address], [place], [address] Phone: [phone]   Fax:  [phone]  Name: John James MRN: [id number] Date of Birth: 1970/06/09

## 2020-12-11 ENCOUNTER — Telehealth: Payer: Self-pay | Admitting: Physical Therapy

## 2020-12-11 ENCOUNTER — Ambulatory Visit: Payer: BLUE CROSS/BLUE SHIELD | Admitting: Physical Therapy

## 2020-12-11 NOTE — Telephone Encounter (Signed)
Attempted to call pt because of absence from apt. Unable to reach and VM full so unable to leave message.

## 2020-12-17 ENCOUNTER — Ambulatory Visit: Payer: BLUE CROSS/BLUE SHIELD

## 2020-12-17 DIAGNOSIS — M25651 Stiffness of right hip, not elsewhere classified: Secondary | ICD-10-CM

## 2020-12-17 DIAGNOSIS — M25551 Pain in right hip: Secondary | ICD-10-CM

## 2020-12-17 DIAGNOSIS — R29898 Other symptoms and signs involving the musculoskeletal system: Secondary | ICD-10-CM

## 2020-12-17 NOTE — Therapy (Signed)
[facility] [address], [place], [address] Phone: [phone]   Fax:  [phone]  Physical Therapy Treatment  Patient Details  Name: Firman Petrow MRN: [id number] Date of Birth: 02-18-1971 Referring Provider (PT): Dr. Rosita Kea   Encounter Date: 12/17/2020   PT End of Session - 12/17/20 1524     Visit Number 7    Number of Visits 21    Date for PT Re-Evaluation 01/23/21    Authorization Type [employer];    Authorization Time Period 11/14/20-01/23/21    Progress Note Due on Visit 10    PT Start Time 1500    PT Stop Time 1548    PT Time Calculation (min) 48 min    Activity Tolerance Patient tolerated treatment well;No increased pain    Behavior During Therapy WFL for tasks assessed/performed             Past Medical History:  Diagnosis Date   Arthritis    Back pain    Erectile dysfunction    GERD (gastroesophageal reflux disease)    Heart murmur    as a child   History of kidney stones    History of MRSA infection 2016   left forearm from spider bite   Hypercholesteremia    Hypertension    Sleep apnea     Past Surgical History:  Procedure Laterality Date   CARPOMETACARPAL (CMC) FUSION OF THUMB Right 04/07/2018   Procedure: CARPOMETACARPAL (CMC) ARHTROPLASTY OF THUMB;  Surgeon: Kennedy Bucker, MD;  Location: [facility];  Service: Orthopedics;  Laterality: Right;   CERVICAL FUSION  11/2016   ELBOW SURGERY Left    FRACTURE SURGERY     KNEE ARTHROSCOPY WITH MEDIAL MENISECTOMY Left 01/13/2018   Procedure: KNEE ARTHROSCOPY WITH MEDIAL MENISECTOMY;  Surgeon: Kennedy Bucker, MD;  Location: [facility];  Service: Orthopedics;  Laterality: Left;   LAPAROSCOPIC GASTRIC BYPASS     SHOULDER SURGERY Left 2017   TONSILLECTOMY     as a child   TOTAL HIP ARTHROPLASTY Right 10/17/2020   Procedure: TOTAL HIP ARTHROPLASTY ANTERIOR APPROACH;  Surgeon: Kennedy Bucker, MD;  Location: [facility];  Service: Orthopedics;   Laterality: Right;    There were no vitals filed for this visit.   Subjective Assessment - 12/17/20 1516     Subjective Pt doing well, report simprovement in groin pain after last session, still working on some new stretches from last session for his groin muscles. Rt knee has hurt more on lateral side. Pt also reports conitnued discomfor twith prolonged sitting.    Pertinent History Right tha 2/2 OA    Pain Score 5     Pain Location --   Rt lateral knee;           INTERVENTION THIS DATE:  -Sagittal heel slides x25 Rt  -education on avoiding end range flexion for prolonged periods  -treadmill walking 4 minutes @ 2.56mph  -Stairs lunge stretch Rt foot on 3rd step for flexion stretch; left foot on 2nd step for extension stretch 2x30sec bilat -Prone Quad stretch 2x30sec with stretch  -Prone MFR to lateral distal VL, ART  -Prone Hip IR stretch 1x30sec -Prone ER 2x30sec; Knee fulcrurm mobilization 2x30sec  -chair rotation 2x30sec hold, green ball knees  -LAQ 1x15 @ 5lb -1x15 @ 7.5lb  -BTB side stepping 1x20 (at knees)  -rocker board 1x20 (frontal plane)  -rocker bosu 1x20 (frontal plane rocking)   *external rotation is very restricted and painful, gentle stretches utilized  Pictures provided for HEP updates     PT Education - 12/17/20 1520     Education Details ways to maintain loose hip with sitting, and avoid prolonged posturing at end range flexion    Person(s) Educated Patient    Methods Explanation    Comprehension Verbalized understanding              PT Short Term Goals - 12/03/20 0347       PT SHORT TERM GOAL #1   Title Pt will demonstrate understanding of home exercise plan.    Baseline 11/13/20: NT    Time 2    Period Weeks    Status Achieved    Target Date 11/28/20               PT Long Term Goals - 12/03/20 0811       PT LONG TERM GOAL #1   Title Pt will demonstrate symmetrical hip ROM to improve gait mechanics.    Baseline 11/14/20: Hip  Flexion R/L 80/120, Hip Abd R/L 20/40, Hip Ext R/L: 30/45, Hip IR 30/45    Time 10    Period Weeks    Status New      PT LONG TERM GOAL #2   Title Patient will demonstrate symmetrical hip strength to improve mobility.    Baseline 9/8: Hip Flexion R/L 4/5 5/5, Hip Abd R/L 3+/5 5/5, Hip Ext NT, Hip Add NT    Time 10    Period Weeks    Status New      PT LONG TERM GOAL #3   Title Patient will have improved function and activity level as evidenced by an increase in FOTO score by 10 points or more.    Baseline 9/8: 47/65    Time 10    Period Weeks    Status New      PT LONG TERM GOAL #4   Title Patient will demonstrate symmetrical heel strike and step length without use of AD.    Baseline 9/8: NT    Time 10    Period Weeks                   Plan - 12/17/20 1639     Clinical Impression Statement Continued with current plan of care as laid out in evaluation and recent prior sessions. Pt has been using new adductor longus/pectineus stretches at home with good success, no pain in the groin today. Pain persists in lateral knee, hence added in quads stretch for HEP that targets vastus lateralis. Manual therapy continued at VL also. All interventions tolerated as expected by author. Mobility and strength continue to progress as anticipated. Recovery intervals given as needed based on signs of exertion and/or pt request. Pt educated on best technique for each intervention- author uses verbal, visual, tactile cues to optimize learning. Author takes steps to maximize patient independence when appropriate. Pt remains highly motivated. The patient's therapy prognosis indicates continued potential for improvement, anticipate that future progress is attainable in a reasonable/predictable timeframe. Maximum improvement is within reach. Pt will continue to benefit from skilled PT services to address deficits and impairment identified in evaluation in order to maximize independence and safety in  basic mobility required for performance of ADL, IADL, and leisure.    Personal Factors and Comorbidities Time since onset of injury/illness/exacerbation;Past/Current Experience    Stability/Clinical Decision Making Stable/Uncomplicated    Clinical Decision Making Low    Rehab Potential Good    PT Frequency 2x /  week    PT Duration Other (comment)    PT Treatment/Interventions Therapeutic activities;Therapeutic exercise;Balance training;Neuromuscular re-education;Moist Heat;Stair training;Gait training;Cryotherapy;Aquatic Therapy;Passive range of motion;Dry needling;Joint Manipulations;Manual techniques;Scar mobilization    PT Next Visit Plan Gait training on treadmill, lateral and forward step downs. Address putting on shoes in sitting.    PT Home Exercise Plan [id number]    Consulted and Agree with Plan of Care Patient             Patient will benefit from skilled therapeutic intervention in order to improve the following deficits and impairments:  Abnormal gait, Pain, Difficulty walking, Decreased strength, Impaired flexibility, Decreased range of motion, Decreased balance, Decreased activity tolerance  Visit Diagnosis: Right hip pain  Weakness of right hip  Stiffness of right hip, not elsewhere classified     Problem List Patient Active Problem List   Diagnosis Date Noted   Status post total hip replacement, right 10/17/2020   5:00 PM, 12/17/20 Rosamaria Lints, PT, DPT Physical Therapist - [place] [phone] (Office)   [place] C, PT 12/17/2020, 4:42 PM  [place] [facility]TS [facility] [address] [place], [address] Phone: [phone]   Fax:  [phone]  Name: Travin Marik MRN: [id number] Date of Birth: 03/20/1970

## 2020-12-19 ENCOUNTER — Telehealth: Payer: Self-pay

## 2020-12-19 ENCOUNTER — Ambulatory Visit: Payer: BLUE CROSS/BLUE SHIELD | Admitting: Physical Therapy

## 2020-12-19 NOTE — Telephone Encounter (Signed)
Pt did not show for his 11:45 appointment this date. Author executed a successful telephone call wherein pt reports his wife had a postop medical appointment this morning in [place]. Pt reports he made this known to our offices a couple weeks back.   Pt was informed that he has no other future scheduled appointments. Pt reported he will call back and schedule with front desk reps once he has his calendar available.   3:10 PM, 12/19/20 Rosamaria Lints, PT, DPT Physical Therapist - [place] [phone] (Office)

## 2021-08-12 ENCOUNTER — Other Ambulatory Visit: Payer: Self-pay | Admitting: Physical Medicine and Rehabilitation

## 2021-08-12 DIAGNOSIS — M5416 Radiculopathy, lumbar region: Secondary | ICD-10-CM

## 2021-08-20 ENCOUNTER — Ambulatory Visit
Admission: RE | Admit: 2021-08-20 | Discharge: 2021-08-20 | Disposition: A | Discharge status: Home / Self Care | Payer: No Typology Code available for payment source | Source: Ambulatory Visit | Attending: Physical Medicine and Rehabilitation | Admitting: Physical Medicine and Rehabilitation

## 2021-08-20 DIAGNOSIS — M5416 Radiculopathy, lumbar region: Secondary | ICD-10-CM

## 2021-11-05 ENCOUNTER — Telehealth: Payer: Self-pay | Admitting: Physical Therapy

## 2021-11-05 ENCOUNTER — Ambulatory Visit: Payer: BLUE CROSS/BLUE SHIELD | Admitting: Physical Therapy

## 2021-11-05 NOTE — Telephone Encounter (Signed)
Called pt to inquiry about absence from PT. Did not reach so left VM instructing him to call back to reschedule eval.

## 2021-11-06 ENCOUNTER — Ambulatory Visit: Payer: BLUE CROSS/BLUE SHIELD | Attending: Orthopedic Surgery | Admitting: Physical Therapy

## 2021-11-06 DIAGNOSIS — M25552 Pain in left hip: Secondary | ICD-10-CM | POA: Diagnosis present

## 2021-11-06 DIAGNOSIS — M5459 Other low back pain: Secondary | ICD-10-CM | POA: Insufficient documentation

## 2021-11-06 NOTE — Therapy (Signed)
OUTPATIENT PHYSICAL THERAPY THORACOLUMBAR EVALUATION   Patient Name: John James MRN: [id number] DOB:January 21, 1971, 51 y.o., male Today's Date: 11/06/2021   PT End of Session - 11/06/21 1744     Visit Number 1    Number of Visits 16    Date for PT Re-Evaluation 01/01/22    Authorization Type [employer];    Progress Note Due on Visit 10    PT Start Time 1545    PT Stop Time 1630    PT Time Calculation (min) 45 min    Activity Tolerance Patient tolerated treatment well    Behavior During Therapy WFL for tasks assessed/performed             Past Medical History:  Diagnosis Date   Arthritis    Back pain    Erectile dysfunction    GERD (gastroesophageal reflux disease)    Heart murmur    as a child   History of kidney stones    History of MRSA infection 2016   left forearm from spider bite   Hypercholesteremia    Hypertension    Sleep apnea    Past Surgical History:  Procedure Laterality Date   CARPOMETACARPAL (CMC) FUSION OF THUMB Right 04/07/2018   Procedure: CARPOMETACARPAL (CMC) ARHTROPLASTY OF THUMB;  Surgeon: Kennedy Bucker, MD;  Location: [facility];  Service: Orthopedics;  Laterality: Right;   CERVICAL FUSION  11/2016   ELBOW SURGERY Left    FRACTURE SURGERY     KNEE ARTHROSCOPY WITH MEDIAL MENISECTOMY Left 01/13/2018   Procedure: KNEE ARTHROSCOPY WITH MEDIAL MENISECTOMY;  Surgeon: Kennedy Bucker, MD;  Location: [facility];  Service: Orthopedics;  Laterality: Left;   LAPAROSCOPIC GASTRIC BYPASS     SHOULDER SURGERY Left 2017   TONSILLECTOMY     as a child   TOTAL HIP ARTHROPLASTY Right 10/17/2020   Procedure: TOTAL HIP ARTHROPLASTY ANTERIOR APPROACH;  Surgeon: Kennedy Bucker, MD;  Location: [facility];  Service: Orthopedics;  Laterality: Right;   Patient Active Problem List   Diagnosis Date Noted   Status post total hip replacement, right 10/17/2020    PCP: Unknown from [facility]   REFERRING PROVIDER: Dr. Lasandra Beech PA-C   REFERRING DIAG: Left sided  low back pain   Rationale for Evaluation and Treatment Rehabilitation  THERAPY DIAG:  Other low back pain  Pain in left hip  ONSET DATE: 07/07/21  SUBJECTIVE:                                                                                                                                                                                           SUBJECTIVE STATEMENT: See pertinent history  PERTINENT HISTORY:  Pt reports having increased right sided low back pain since having his hip replacement surgery last year. It started 3-4 months ago and he has been receiving steriod injections from Dr. Yves Dill. The shots only help temporarily about 1 month. Pain is localized to the left buttocks but does not radiate down his leg. He does not currently have an exercise routine, but he is hoping to get back to gym soon.   PAIN:  Are you having pain? Yes: NPRS scale: 8/10 Pain location: Left sided low back pain  Pain description: Pain is constant; Sharp and throbbing  Aggravating factors: Waking up  Relieving factors: Ice    PRECAUTIONS: None  WEIGHT BEARING RESTRICTIONS No  FALLS:  Has patient fallen in last 6 months? No  LIVING ENVIRONMENT: Lives with: lives with their spouse Lives in: House/apartment Stairs: Yes: Internal: 13 steps; right sided railing and External: 1-2 step with no railings.  Has following equipment at home: None  OCCUPATION: Retired   PLOF: Independent  PATIENT GOALS Decrease his low back pain and improve spinal mobility    OBJECTIVE:              VITALS: BP 133/96 SpO2 99 hr 78  DIAGNOSTIC FINDINGS:  CLINICAL DATA:  Low back pain, chronic.   EXAM: MRI LUMBAR SPINE WITHOUT CONTRAST   TECHNIQUE: Multiplanar, multisequence MR imaging of the lumbar spine was performed. No intravenous contrast was administered.   COMPARISON:  None Available.   FINDINGS: Segmentation: Standard segmentation is assumed. The inferior-most fully formed intervertebral disc is  labeled L5-S1.   Alignment:  No substantial sagittal subluxation.   Vertebrae: L5-S1 degenerative/discogenic endplate signal changes. Probably stress related edema in the L5 pedicles bilaterally. Otherwise, no focal marrow edema to suggest acute fracture or discitis/osteomyelitis. No suspicious bone lesions. L1 benign vertebral venous malformation.   Conus medullaris and cauda equina: Conus extends to the L1-L2 level. Conus appears normal.   Paraspinal and other soft tissues: Partially imaged right renal cysts.   Disc levels:   T12-L1: No significant disc protrusion, foraminal stenosis, or canal stenosis.   L1-L2: No significant disc protrusion, foraminal stenosis, or canal stenosis.   L2-L3: Slight disc bulging without significant stenosis.   L3-L4: Mild disc bulging and bilateral facet arthropathy without significant stenosis.   L4-L5: Mild disc bulging and bilateral facet arthropathy. No significant canal or foraminal stenosis.   L5-S1: Degenerative disc height loss and desiccation with degenerative endplate edema. Disc bulging and endplate spurring with bilateral facet arthropathy. Superimposed small central disc protrusion. Resulting mild left foraminal stenosis, similar. No significant canal or right foraminal stenosis.   IMPRESSION: Similar multilevel degenerative change, greatest at L5-S1 and described above. Mild left foraminal stenosis at L5-S1. No significant canal stenosis.     Electronically Signed   By: Feliberto Harts M.D.   On: 08/21/2021 14:07  PATIENT SURVEYS:  FOTO 35/100 with target of 48   SCREENING FOR RED FLAGS: Bowel or bladder incontinence: No Spinal tumors: No Cauda equina syndrome: No Compression fracture: No Abdominal aneurysm: No  COGNITION:  Overall cognitive status: Within functional limits for tasks assessed     SENSATION: WFL  MUSCLE LENGTH: Hamstrings: Right 90 deg; Left 90 deg Thomas test: Negative bilateral    POSTURE: No Significant postural limitations  PALPATION: Left PSIS   LUMBAR ROM:   Active  A/PROM  eval  Flexion 100%  Extension 100%  Right lateral flexion 100%  Left lateral flexion 100%*  Right rotation 100%  Left rotation 100%   (Blank rows = not tested)  LOWER EXTREMITY ROM:         Active  Right 11/06/2021 Left 11/06/2021  Hip flexion 120 120  Hip extension 30 30  Hip abduction 45 45  Hip adduction 30 30  Hip internal rotation 45 45  Hip external rotation 45 45  Knee flexion 135 135  Knee extension 0 0  Ankle dorsiflexion 20 20  Ankle plantarflexion 50 50  Ankle inversion 35 35  Ankle eversion 15 15   (Blank rows = not tested)     LOWER EXTREMITY MMT:    MMT Right eval Left eval  Hip flexion 4 4+  Hip extension 4+ 4+  Hip abduction 4 4-  Hip adduction 4- 4-  Hip internal rotation    Hip external rotation    Knee flexion 4+ 5  Knee extension 4+ 5  Ankle dorsiflexion 5 5  Ankle plantarflexion    Ankle inversion    Ankle eversion     (Blank rows = not tested)  .  LUMBAR SPECIAL TESTS:  Slump test negative, Straight leg raise test: Negative, FABER test: positive RLE, and Thigh Thrust negative bilateral  FUNCTIONAL TESTS: None performed   GAIT: Distance walked: 50 ft  Assistive device utilized: None Level of assistance: Complete Independence Comments: Gait analysis not performed     TODAY'S TREATMENT  Seated Hip ER 2 x 30 sec  Mini-squats with #8 3 x 10  Seated Tensioners LLE 1 x 10    PATIENT EDUCATION:  Education details: form and technique for appropriate exercise and  Person educated: Patient Education method: Consulting civil engineer, Demonstration, Verbal cues, and Handouts Education comprehension: verbalized understanding and returned demonstration   HOME EXERCISE PROGRAM: Access Code: [email or web address] URL: [email or web address] Date: 11/06/2021 Prepared by: Bradly Chris  Exercises - Seated Hip External Rotation  Stretch  - 7 x weekly - 3 reps - 30 hold - Seated Sciatic Tensioner  - 7 x weekly - 10 reps - Mini Squat  - 3 x weekly - 3 sets - 10 reps  ASSESSMENT:  CLINICAL IMPRESSION: Patient is a 51 y.o. male who was seen today for physical therapy evaluation and treatment for left sided low back pain occurring from right hip arthroplasty. He exhibits hip weakness and decreased hip ROM that are likely contributing to his low back pain. Lumbar radiculopathy and SIJ pathology ruled out during examination. He will benefit from skilled PT to improve hip strength and ROM to return to performing ADLs pain free.    OBJECTIVE IMPAIRMENTS decreased ROM, decreased strength, impaired flexibility, and pain.   ACTIVITY LIMITATIONS carrying, lifting, bending, sitting, standing, squatting, sleeping, stairs, transfers, bed mobility, bathing, dressing, and locomotion level  PARTICIPATION LIMITATIONS: shopping, community activity, and yard work, exercise at gym   Duck Age, Past/current experiences, and Time since onset of injury/illness/exacerbation are also affecting patient's functional outcome.   REHAB POTENTIAL: Fair chronicity of low back pain  CLINICAL DECISION MAKING: Stable/uncomplicated  EVALUATION COMPLEXITY: Low   GOALS: Goals reviewed with patient? No  SHORT TERM GOALS: Target date: 11/20/2021  Pt will be independent with HEP in order to improve strength and balance in order to decrease fall risk and improve function at home and work. Baseline: NT Goal status: INITIAL   LONG TERM GOALS: Target date: 01/01/2022  Patient will have improved function and activity level as evidenced by an increase in FOTO score by 10 points or more.   Baseline: 35  Goal status:  INITIAL  2.  Patient will improve hip strength by >=1/2 MMT to increase hip stability to off load forces on lumbar spine. Baseline: See MMT above  Goal status: INITIAL  3.  Patient will decrease low back pain to <=5/10 for  improved activity tolerance and quality of life. Baseline: 8/10 Goal status: INITIAL    PLAN: PT FREQUENCY: 1-2x/week  PT DURATION: 8 weeks  PLANNED INTERVENTIONS: Therapeutic exercises, Neuromuscular re-education, Balance training, Gait training, Patient/Family education, Self Care, Joint mobilization, Stair training, Aquatic Therapy, Dry Needling, Electrical stimulation, Spinal manipulation, Spinal mobilization, Cryotherapy, Moist heat, Manual therapy, and Re-evaluation.  PLAN FOR NEXT SESSION: Progress hip strengthening mobility exercises and assess abdominal strength   Ellin Goodie PT, DPT  Johnn Hai, PT 11/06/2021, 6:02 PM

## 2021-11-11 ENCOUNTER — Ambulatory Visit: Payer: BLUE CROSS/BLUE SHIELD | Admitting: Physical Therapy

## 2021-11-13 ENCOUNTER — Encounter: Payer: No Typology Code available for payment source | Admitting: Physical Therapy

## 2021-11-13 ENCOUNTER — Ambulatory Visit: Payer: BLUE CROSS/BLUE SHIELD | Admitting: Physical Therapy

## 2021-11-18 ENCOUNTER — Encounter: Payer: No Typology Code available for payment source | Admitting: Physical Therapy

## 2021-11-18 ENCOUNTER — Ambulatory Visit: Payer: BLUE CROSS/BLUE SHIELD | Attending: Orthopedic Surgery | Admitting: Physical Therapy

## 2021-11-18 ENCOUNTER — Telehealth: Payer: Self-pay | Admitting: Physical Therapy

## 2021-11-18 NOTE — Telephone Encounter (Signed)
Called pt to inquire about absence. Pt did not respond so left VM

## 2021-11-20 ENCOUNTER — Encounter: Payer: No Typology Code available for payment source | Admitting: Physical Therapy

## 2021-11-24 ENCOUNTER — Encounter: Payer: No Typology Code available for payment source | Admitting: Physical Therapy

## 2021-11-25 ENCOUNTER — Telehealth: Payer: Self-pay | Admitting: Physical Therapy

## 2021-11-25 ENCOUNTER — Ambulatory Visit: Payer: BLUE CROSS/BLUE SHIELD | Admitting: Physical Therapy

## 2021-11-25 NOTE — Telephone Encounter (Signed)
Called pt to inquire about absence from PT. Pt did not respond, so left VM instructing pt that he has now missed two appointments and that if he misses one more he will be removed from schedule.

## 2021-11-26 ENCOUNTER — Encounter: Payer: No Typology Code available for payment source | Admitting: Physical Therapy

## 2021-11-27 ENCOUNTER — Ambulatory Visit: Payer: BLUE CROSS/BLUE SHIELD | Admitting: Physical Therapy

## 2021-12-01 ENCOUNTER — Encounter: Payer: No Typology Code available for payment source | Admitting: Physical Therapy

## 2021-12-02 ENCOUNTER — Ambulatory Visit: Payer: BLUE CROSS/BLUE SHIELD | Admitting: Physical Therapy

## 2021-12-03 ENCOUNTER — Encounter: Payer: No Typology Code available for payment source | Admitting: Physical Therapy

## 2021-12-04 ENCOUNTER — Ambulatory Visit: Payer: BLUE CROSS/BLUE SHIELD | Admitting: Physical Therapy

## 2021-12-09 ENCOUNTER — Encounter: Payer: No Typology Code available for payment source | Admitting: Physical Therapy

## 2021-12-11 ENCOUNTER — Encounter: Payer: No Typology Code available for payment source | Admitting: Physical Therapy

## 2021-12-16 ENCOUNTER — Encounter: Payer: No Typology Code available for payment source | Admitting: Physical Therapy

## 2021-12-18 ENCOUNTER — Encounter: Payer: No Typology Code available for payment source | Admitting: Physical Therapy

## 2023-08-04 IMAGING — DX DG HIP (WITH OR WITHOUT PELVIS) 2-3V*R*
2 series · 2 of 2 positions shown · non-contrast
Comparison: None.

CLINICAL DATA: Status post right hip arthroplasty.

EXAM:
DG HIP (WITH OR WITHOUT PELVIS) 2-3V RIGHT

[hip ap]
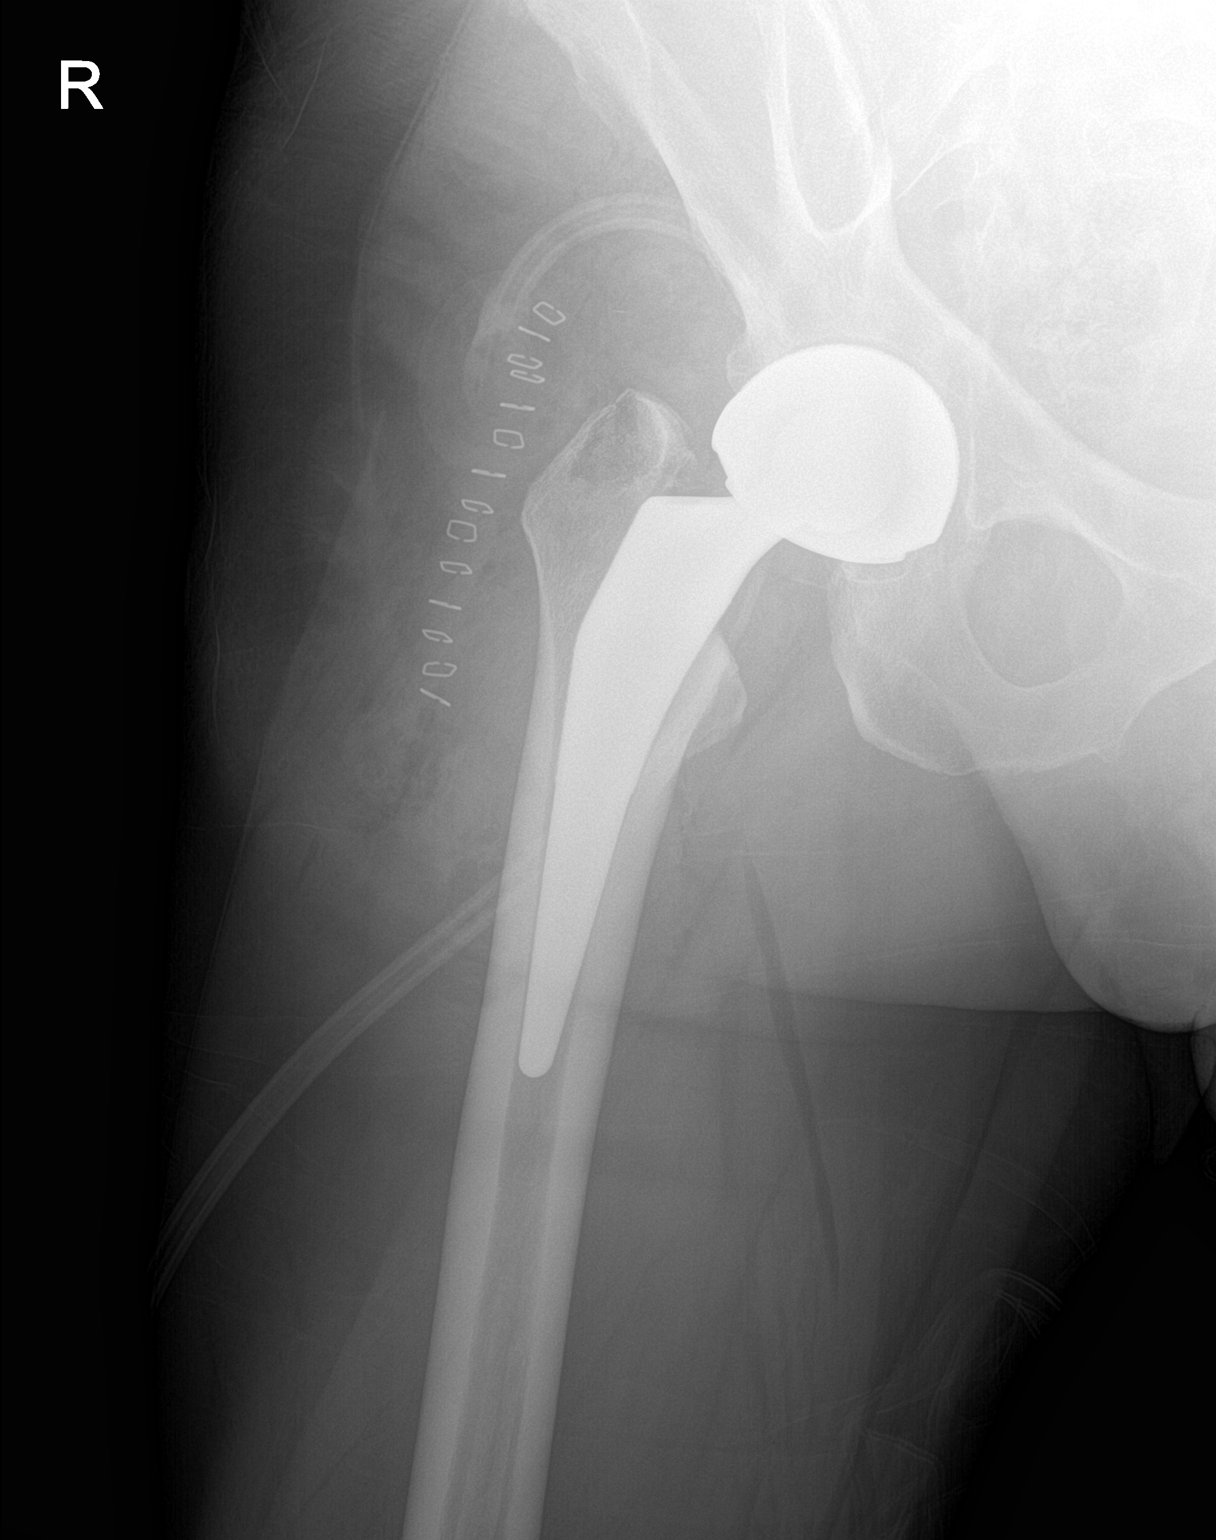

[hip lat]
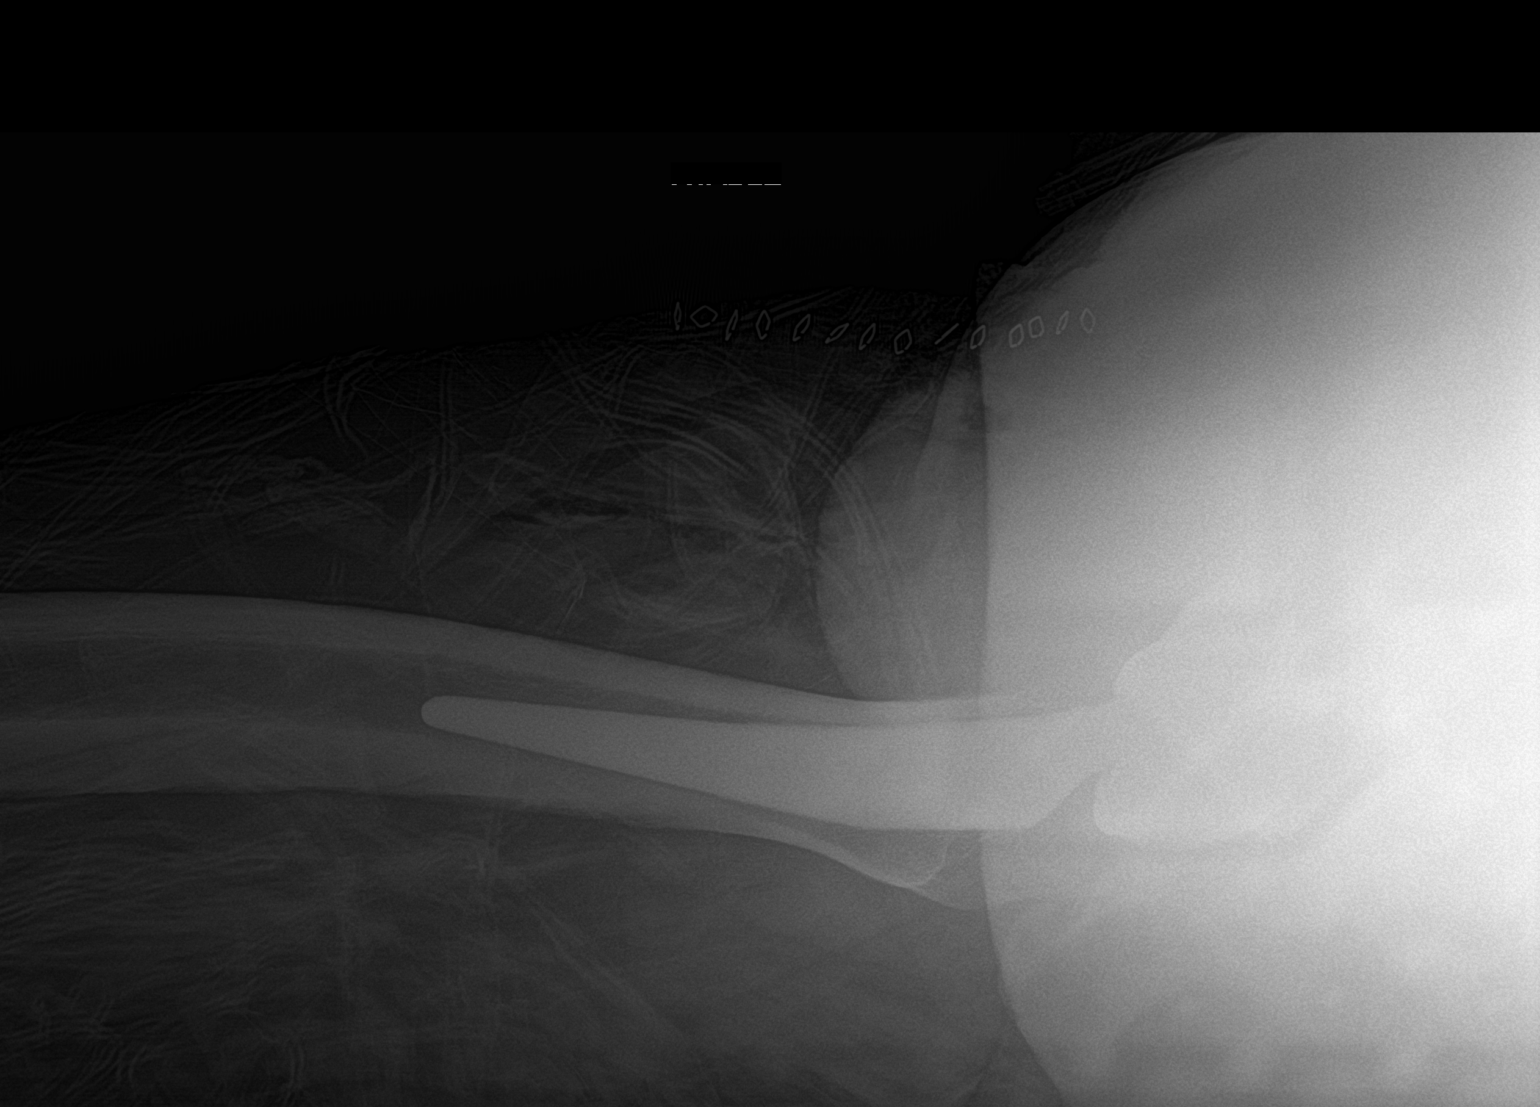

[2 of 2 positions shown; findings below may reference images not displayed]

FINDINGS: Postoperative changes from right hip arthroplasty identified. The
hardware components are in anatomic alignment. No periprosthetic
fracture or dislocation. There is a indeterminate lucent structure
within the greater trochanter of the proximal right femur measuring
2.6 x 2.0 cm.
IMPRESSION: 1. Status post right hip arthroplasty.
2. Indeterminate lucent structure within the greater trochanter of
the proximal right femur. Recommend correlation with pre
arthroplasty imaging. If this is a new finding or progressive
finding then further evaluation with MRI of the right hip would be
advised.

## 2024-01-04 ENCOUNTER — Other Ambulatory Visit: Payer: Self-pay | Admitting: Physical Medicine and Rehabilitation

## 2024-01-04 DIAGNOSIS — M5412 Radiculopathy, cervical region: Secondary | ICD-10-CM

## 2024-01-05 ENCOUNTER — Encounter: Payer: Self-pay | Admitting: Physical Medicine and Rehabilitation

## 2024-01-15 LAB — COLOGUARD: COLOGUARD: NEGATIVE

## 2024-01-20 ENCOUNTER — Inpatient Hospital Stay
Admission: RE | Admit: 2024-01-20 | Discharge: 2024-01-20 | Disposition: A | Source: Ambulatory Visit | Attending: Physical Medicine and Rehabilitation | Admitting: Physical Medicine and Rehabilitation

## 2024-01-20 DIAGNOSIS — M5412 Radiculopathy, cervical region: Secondary | ICD-10-CM
